# Patient Record
Sex: Female | Born: 1945 | Race: White | Hispanic: No | Smoking: Never smoker
Health system: Southern US, Community
[De-identification: ages and names within clinical notes are randomized; demographics above are authoritative.]

## PROBLEM LIST (undated history)

## (undated) DIAGNOSIS — N390 Urinary tract infection, site not specified: Secondary | ICD-10-CM

## (undated) DIAGNOSIS — L905 Scar conditions and fibrosis of skin: Secondary | ICD-10-CM

## (undated) DIAGNOSIS — K5900 Constipation, unspecified: Secondary | ICD-10-CM

## (undated) DIAGNOSIS — M109 Gout, unspecified: Secondary | ICD-10-CM

## (undated) DIAGNOSIS — D509 Iron deficiency anemia, unspecified: Secondary | ICD-10-CM

## (undated) DIAGNOSIS — R1312 Dysphagia, oropharyngeal phase: Secondary | ICD-10-CM

## (undated) DIAGNOSIS — E785 Hyperlipidemia, unspecified: Secondary | ICD-10-CM

## (undated) DIAGNOSIS — I209 Angina pectoris, unspecified: Secondary | ICD-10-CM

## (undated) DIAGNOSIS — K219 Gastro-esophageal reflux disease without esophagitis: Secondary | ICD-10-CM

## (undated) DIAGNOSIS — H5410 Blindness, one eye, low vision other eye, unspecified eyes: Secondary | ICD-10-CM

## (undated) DIAGNOSIS — M6281 Muscle weakness (generalized): Secondary | ICD-10-CM

## (undated) DIAGNOSIS — J4489 Other specified chronic obstructive pulmonary disease: Secondary | ICD-10-CM

## (undated) DIAGNOSIS — M62838 Other muscle spasm: Secondary | ICD-10-CM

## (undated) DIAGNOSIS — R0602 Shortness of breath: Secondary | ICD-10-CM

## (undated) DIAGNOSIS — I10 Essential (primary) hypertension: Secondary | ICD-10-CM

## (undated) DIAGNOSIS — R262 Difficulty in walking, not elsewhere classified: Secondary | ICD-10-CM

## (undated) DIAGNOSIS — E1129 Type 2 diabetes mellitus with other diabetic kidney complication: Secondary | ICD-10-CM

## (undated) DIAGNOSIS — R2689 Other abnormalities of gait and mobility: Secondary | ICD-10-CM

## (undated) DIAGNOSIS — Z5189 Encounter for other specified aftercare: Secondary | ICD-10-CM

## (undated) DIAGNOSIS — J449 Chronic obstructive pulmonary disease, unspecified: Secondary | ICD-10-CM

## (undated) DIAGNOSIS — I5030 Unspecified diastolic (congestive) heart failure: Secondary | ICD-10-CM

## (undated) DIAGNOSIS — G4700 Insomnia, unspecified: Secondary | ICD-10-CM

## (undated) DIAGNOSIS — E559 Vitamin D deficiency, unspecified: Secondary | ICD-10-CM

## (undated) DIAGNOSIS — I214 Non-ST elevation (NSTEMI) myocardial infarction: Secondary | ICD-10-CM

## (undated) DIAGNOSIS — J309 Allergic rhinitis, unspecified: Secondary | ICD-10-CM

## (undated) DIAGNOSIS — I2510 Atherosclerotic heart disease of native coronary artery without angina pectoris: Secondary | ICD-10-CM

## (undated) DIAGNOSIS — R21 Rash and other nonspecific skin eruption: Secondary | ICD-10-CM

## (undated) DIAGNOSIS — I509 Heart failure, unspecified: Secondary | ICD-10-CM

## (undated) DIAGNOSIS — E876 Hypokalemia: Secondary | ICD-10-CM

## (undated) DIAGNOSIS — R52 Pain, unspecified: Secondary | ICD-10-CM

## (undated) DIAGNOSIS — R7881 Bacteremia: Secondary | ICD-10-CM

## (undated) DIAGNOSIS — F319 Bipolar disorder, unspecified: Secondary | ICD-10-CM

## (undated) HISTORY — PX: GASTRIC BYPASS: SHX52

## (undated) HISTORY — PX: CHOLECYSTECTOMY: SHX55

## (undated) HISTORY — PX: ABDOMINAL SURGERY: SHX537

## (undated) HISTORY — PX: ABDOMINAL HYSTERECTOMY: SHX81

---

## 2015-04-23 ENCOUNTER — Other Ambulatory Visit
Admission: RE | Admit: 2015-04-23 | Discharge: 2015-04-23 | Disposition: A | Discharge status: Home / Self Care | Payer: Medicare (Managed Care) | Source: Skilled Nursing Facility | Attending: Family Medicine | Admitting: Family Medicine

## 2015-04-23 DIAGNOSIS — R7881 Bacteremia: Secondary | ICD-10-CM | POA: Diagnosis present

## 2015-04-23 LAB — CREATININE, SERUM
Creatinine, Ser: 1.61 mg/dL — ABNORMAL HIGH (ref 0.44–1.00)
GFR calc non Af Amer: 31 mL/min — ABNORMAL LOW (ref >60)
GFR, EST AFRICAN AMERICAN: 36 mL/min — AB (ref >60)

## 2015-04-23 LAB — VANCOMYCIN, TROUGH: VANCOMYCIN TR: 31 ug/mL — AB (ref 10–20)

## 2017-10-25 ENCOUNTER — Other Ambulatory Visit
Admission: RE | Admit: 2017-10-25 | Discharge: 2017-10-25 | Disposition: A | Discharge status: Home / Self Care | Payer: Medicare Other | Source: Ambulatory Visit | Attending: Family Medicine | Admitting: Family Medicine

## 2017-10-25 DIAGNOSIS — I5030 Unspecified diastolic (congestive) heart failure: Secondary | ICD-10-CM | POA: Diagnosis present

## 2017-10-25 LAB — BRAIN NATRIURETIC PEPTIDE: B NATRIURETIC PEPTIDE 5: 422 pg/mL — AB (ref 0.0–100.0)

## 2017-10-27 ENCOUNTER — Other Ambulatory Visit
Admission: RE | Admit: 2017-10-27 | Discharge: 2017-10-27 | Disposition: A | Discharge status: Home / Self Care | Payer: Medicare Other | Source: Ambulatory Visit | Attending: Family Medicine | Admitting: Family Medicine

## 2017-10-27 DIAGNOSIS — R0602 Shortness of breath: Secondary | ICD-10-CM | POA: Insufficient documentation

## 2017-10-27 LAB — BRAIN NATRIURETIC PEPTIDE: B Natriuretic Peptide: 301 pg/mL — ABNORMAL HIGH (ref 0.0–100.0)

## 2017-11-03 ENCOUNTER — Encounter: Payer: Self-pay | Admitting: Emergency Medicine

## 2017-11-03 ENCOUNTER — Other Ambulatory Visit: Payer: Self-pay

## 2017-11-03 ENCOUNTER — Inpatient Hospital Stay
Admission: EM | Admit: 2017-11-03 | Discharge: 2017-11-08 | DRG: 683 | Disposition: A | Discharge status: Skilled Nursing Facility | Payer: Medicare Other | Attending: Internal Medicine | Admitting: Internal Medicine

## 2017-11-03 DIAGNOSIS — Z90710 Acquired absence of both cervix and uterus: Secondary | ICD-10-CM

## 2017-11-03 DIAGNOSIS — I252 Old myocardial infarction: Secondary | ICD-10-CM

## 2017-11-03 DIAGNOSIS — Z794 Long term (current) use of insulin: Secondary | ICD-10-CM | POA: Diagnosis not present

## 2017-11-03 DIAGNOSIS — R109 Unspecified abdominal pain: Secondary | ICD-10-CM

## 2017-11-03 DIAGNOSIS — E1122 Type 2 diabetes mellitus with diabetic chronic kidney disease: Secondary | ICD-10-CM | POA: Diagnosis present

## 2017-11-03 DIAGNOSIS — Z882 Allergy status to sulfonamides status: Secondary | ICD-10-CM | POA: Diagnosis not present

## 2017-11-03 DIAGNOSIS — Z888 Allergy status to other drugs, medicaments and biological substances status: Secondary | ICD-10-CM

## 2017-11-03 DIAGNOSIS — I130 Hypertensive heart and chronic kidney disease with heart failure and stage 1 through stage 4 chronic kidney disease, or unspecified chronic kidney disease: Secondary | ICD-10-CM | POA: Diagnosis present

## 2017-11-03 DIAGNOSIS — K219 Gastro-esophageal reflux disease without esophagitis: Secondary | ICD-10-CM | POA: Diagnosis present

## 2017-11-03 DIAGNOSIS — M109 Gout, unspecified: Secondary | ICD-10-CM | POA: Diagnosis present

## 2017-11-03 DIAGNOSIS — J449 Chronic obstructive pulmonary disease, unspecified: Secondary | ICD-10-CM | POA: Diagnosis present

## 2017-11-03 DIAGNOSIS — D509 Iron deficiency anemia, unspecified: Secondary | ICD-10-CM | POA: Diagnosis present

## 2017-11-03 DIAGNOSIS — E871 Hypo-osmolality and hyponatremia: Secondary | ICD-10-CM | POA: Diagnosis present

## 2017-11-03 DIAGNOSIS — F319 Bipolar disorder, unspecified: Secondary | ICD-10-CM | POA: Diagnosis present

## 2017-11-03 DIAGNOSIS — Z9884 Bariatric surgery status: Secondary | ICD-10-CM

## 2017-11-03 DIAGNOSIS — T502X5A Adverse effect of carbonic-anhydrase inhibitors, benzothiadiazides and other diuretics, initial encounter: Secondary | ICD-10-CM | POA: Diagnosis present

## 2017-11-03 DIAGNOSIS — I2510 Atherosclerotic heart disease of native coronary artery without angina pectoris: Secondary | ICD-10-CM | POA: Diagnosis present

## 2017-11-03 DIAGNOSIS — Z885 Allergy status to narcotic agent status: Secondary | ICD-10-CM | POA: Diagnosis not present

## 2017-11-03 DIAGNOSIS — H5440 Blindness, one eye, unspecified eye: Secondary | ICD-10-CM | POA: Diagnosis present

## 2017-11-03 DIAGNOSIS — N179 Acute kidney failure, unspecified: Principal | ICD-10-CM

## 2017-11-03 DIAGNOSIS — E785 Hyperlipidemia, unspecified: Secondary | ICD-10-CM | POA: Diagnosis present

## 2017-11-03 DIAGNOSIS — I5032 Chronic diastolic (congestive) heart failure: Secondary | ICD-10-CM | POA: Diagnosis present

## 2017-11-03 DIAGNOSIS — E119 Type 2 diabetes mellitus without complications: Secondary | ICD-10-CM

## 2017-11-03 DIAGNOSIS — Z79899 Other long term (current) drug therapy: Secondary | ICD-10-CM

## 2017-11-03 DIAGNOSIS — E860 Dehydration: Secondary | ICD-10-CM | POA: Diagnosis present

## 2017-11-03 DIAGNOSIS — I10 Essential (primary) hypertension: Secondary | ICD-10-CM | POA: Diagnosis present

## 2017-11-03 DIAGNOSIS — Z7982 Long term (current) use of aspirin: Secondary | ICD-10-CM | POA: Diagnosis not present

## 2017-11-03 DIAGNOSIS — Z7902 Long term (current) use of antithrombotics/antiplatelets: Secondary | ICD-10-CM

## 2017-11-03 HISTORY — DX: Rash and other nonspecific skin eruption: R21

## 2017-11-03 HISTORY — DX: Other muscle spasm: M62.838

## 2017-11-03 HISTORY — DX: Vitamin D deficiency, unspecified: E55.9

## 2017-11-03 HISTORY — DX: Chronic obstructive pulmonary disease, unspecified: J44.9

## 2017-11-03 HISTORY — DX: Bacteremia: R78.81

## 2017-11-03 HISTORY — DX: Unspecified diastolic (congestive) heart failure: I50.30

## 2017-11-03 HISTORY — DX: Shortness of breath: R06.02

## 2017-11-03 HISTORY — DX: Essential (primary) hypertension: I10

## 2017-11-03 HISTORY — DX: Hyperlipidemia, unspecified: E78.5

## 2017-11-03 HISTORY — DX: Non-ST elevation (NSTEMI) myocardial infarction: I21.4

## 2017-11-03 HISTORY — DX: Other abnormalities of gait and mobility: R26.89

## 2017-11-03 HISTORY — DX: Dysphagia, oropharyngeal phase: R13.12

## 2017-11-03 HISTORY — DX: Angina pectoris, unspecified: I20.9

## 2017-11-03 HISTORY — DX: Iron deficiency anemia, unspecified: D50.9

## 2017-11-03 HISTORY — DX: Gout, unspecified: M10.9

## 2017-11-03 HISTORY — DX: Hypomagnesemia: E83.42

## 2017-11-03 HISTORY — DX: Pain, unspecified: R52

## 2017-11-03 HISTORY — DX: Encounter for other specified aftercare: Z51.89

## 2017-11-03 HISTORY — DX: Gastro-esophageal reflux disease without esophagitis: K21.9

## 2017-11-03 HISTORY — DX: Insomnia, unspecified: G47.00

## 2017-11-03 HISTORY — DX: Difficulty in walking, not elsewhere classified: R26.2

## 2017-11-03 HISTORY — DX: Other specified chronic obstructive pulmonary disease: J44.89

## 2017-11-03 HISTORY — DX: Bipolar disorder, unspecified: F31.9

## 2017-11-03 HISTORY — DX: Hypokalemia: E87.6

## 2017-11-03 HISTORY — DX: Urinary tract infection, site not specified: N39.0

## 2017-11-03 HISTORY — DX: Scar conditions and fibrosis of skin: L90.5

## 2017-11-03 HISTORY — DX: Blindness, one eye, low vision other eye, unspecified eyes: H54.10

## 2017-11-03 HISTORY — DX: Muscle weakness (generalized): M62.81

## 2017-11-03 HISTORY — DX: Type 2 diabetes mellitus with other diabetic kidney complication: E11.29

## 2017-11-03 HISTORY — DX: Constipation, unspecified: K59.00

## 2017-11-03 HISTORY — DX: Allergic rhinitis, unspecified: J30.9

## 2017-11-03 HISTORY — DX: Atherosclerotic heart disease of native coronary artery without angina pectoris: I25.10

## 2017-11-03 LAB — COMPREHENSIVE METABOLIC PANEL
ALT: 11 U/L — ABNORMAL LOW (ref 14–54)
AST: 13 U/L — ABNORMAL LOW (ref 15–41)
Albumin: 3.1 g/dL — ABNORMAL LOW (ref 3.5–5.0)
Alkaline Phosphatase: 89 U/L (ref 38–126)
Anion gap: 11 (ref 5–15)
BUN: 65 mg/dL — ABNORMAL HIGH (ref 6–20)
CO2: 31 mmol/L (ref 22–32)
Calcium: 8.7 mg/dL — ABNORMAL LOW (ref 8.9–10.3)
Chloride: 77 mmol/L — ABNORMAL LOW (ref 101–111)
Creatinine, Ser: 3.36 mg/dL — ABNORMAL HIGH (ref 0.44–1.00)
GFR, EST AFRICAN AMERICAN: 15 mL/min — AB (ref >60)
GFR, EST NON AFRICAN AMERICAN: 13 mL/min — AB (ref >60)
Glucose, Bld: 131 mg/dL — ABNORMAL HIGH (ref 65–99)
POTASSIUM: 3.8 mmol/L (ref 3.5–5.1)
Sodium: 119 mmol/L — CL (ref 135–145)
Total Bilirubin: 0.5 mg/dL (ref 0.3–1.2)
Total Protein: 6.2 g/dL — ABNORMAL LOW (ref 6.5–8.1)

## 2017-11-03 LAB — CBC
HCT: 26.7 % — ABNORMAL LOW (ref 35.0–47.0)
Hemoglobin: 9.4 g/dL — ABNORMAL LOW (ref 12.0–16.0)
MCH: 30.8 pg (ref 26.0–34.0)
MCHC: 35 g/dL (ref 32.0–36.0)
MCV: 87.9 fL (ref 80.0–100.0)
Platelets: 220 10*3/uL (ref 150–440)
RBC: 3.04 MIL/uL — ABNORMAL LOW (ref 3.80–5.20)
RDW: 15.2 % — ABNORMAL HIGH (ref 11.5–14.5)
WBC: 6.9 10*3/uL (ref 3.6–11.0)

## 2017-11-03 LAB — URINALYSIS, COMPLETE (UACMP) WITH MICROSCOPIC
Bilirubin Urine: NEGATIVE
GLUCOSE, UA: NEGATIVE mg/dL
Hgb urine dipstick: NEGATIVE
Ketones, ur: NEGATIVE mg/dL
Leukocytes, UA: NEGATIVE
NITRITE: NEGATIVE
PH: 6 (ref 5.0–8.0)
Protein, ur: NEGATIVE mg/dL
Specific Gravity, Urine: 1.009 (ref 1.005–1.030)

## 2017-11-03 MED ORDER — SODIUM CHLORIDE 0.9 % IV SOLN
Freq: Once | INTRAVENOUS | Status: AC
  Administered 2017-11-03: 1000 mL via INTRAVENOUS

## 2017-11-03 NOTE — ED Notes (Signed)
Pt placed on bedpan and urine sample collected.

## 2017-11-03 NOTE — ED Triage Notes (Signed)
Pt to ED via EMS from [facility] c/o abnormal labs that were drawn today.  Kidney functions elevated and hypokalemia.  States bilateral leg cramping started today and generalized abd cramping today.  States some nausea today without vomiting.  EMS vitals 110/70 BP, 94% 3L [place] chronically, 60 HR.

## 2017-11-03 NOTE — ED Provider Notes (Signed)
[facility] Emergency Department Provider Note  Time seen: 8:55 PM  I have reviewed the triage vital signs and the nursing notes.   HISTORY  Chief Complaint Abnormal Lab    HPI Cathy Guzman is a 72 y.o. female with a past medical history of MI, CAD, hypertension, CHF, hyperlipidemia, diabetes, frequent UTIs, presents from [facility] for abnormal labs.  According to the patient for the past 1 week she has been feeling somewhat more weak than normal.  States she had blood work performed at [facility] today and was told that her kidney function and her potassium levels were abnormal and she needed to go to the emergency department.  Patient states besides the weakness she has not noted any other acute symptoms.  States some nausea but states this is chronic from her gastric reflux.  States some right knee pain began chronic.  Denies any acute abnormalities.  Denies any chest pain, vomiting or diarrhea.  Denies dysuria, daughter states she was recently treated for urinary tract infection however.  Past Medical History:  Diagnosis Date  . Acute myocardial infarction, subendocardial infarction, initial episode of care (HCC)   . Anginal syndrome (HCC)   . Atopic rhinitis   . Avitaminosis D   . Bacteremia   . Better eye: severe vision impairment; lesser eye: blind   . Bipolar disorder, unspecified (HCC)   . Cannot walk   . CN (constipation)   . Coronary atherosclerosis of native coronary artery   . Encounter for vocational therapy   . Esophageal reflux   . Essential hypertension, malignant   . Gout, unspecified   . Heart failure, diastolic (HCC)   . Hyperlipemia   . Hypomagnesemia   . Hypopotassemia   . Iron deficiency anemia, unspecified   . Muscle weakness (generalized)   . Obstructive chronic bronchitis without exacerbation (HCC)   . Oropharyngeal dysphagia   . Persistent disorder of initiating or maintaining sleep   . Rash and other nonspecific  skin eruption   . Scar painful   . Scissor gait   . Shortness of breath   . Spasm of muscle   . Type II diabetes mellitus with renal manifestations (HCC)   . Urinary tract infection, site not specified     There are no active problems to display for this patient.   Past Surgical History:  Procedure Laterality Date  . ABDOMINAL HYSTERECTOMY    . ABDOMINAL SURGERY    . CHOLECYSTECTOMY    . GASTRIC BYPASS      Prior to Admission medications   Not on File    Allergies  Allergen Reactions  . Ace Inhibitors   . Bactrim [Sulfamethoxazole-Trimethoprim]   . Doxycycline Hyclate   . Hydrocodone   . Oxycodone   . Sulfanilamide     History reviewed. No pertinent family history.  Social History Social History   Tobacco Use  . Smoking status: Never Smoker  . Smokeless tobacco: Never Used  Substance Use Topics  . Alcohol use: No    Frequency: Never  . Drug use: No    Review of Systems Constitutional: Negative for fever.  Positive for generalized weakness Eyes: Negative for visual complaints ENT: Negative for recent illness/congestion Cardiovascular: Negative for chest pain. Respiratory: Negative for shortness of breath.  Negative for cough or congestion Gastrointestinal: Negative for abdominal pain, vomiting positive for nausea. Genitourinary: Negative for urinary compaints.  States recent urinary tract infection. Musculoskeletal: Right knee pain, chronic Skin: Negative for skin complaints  Neurological: Negative for headache All other ROS negative  ____________________________________________   PHYSICAL EXAM:  VITAL SIGNS: ED Triage Vitals  Enc Vitals Group     BP 11/03/17 1959 135/68     Pulse Rate 11/03/17 1959 60     Resp 11/03/17 1959 18     Temp 11/03/17 1959 97.6 F (36.4 C)     Temp Source 11/03/17 1959 Oral     SpO2 11/03/17 1959 100 %     Weight 11/03/17 2000 268 lb (121.6 kg)     Height 11/03/17 2000 5\' 8"  (1.727 m)     Head Circumference --       [facility] --      Pain Score 11/03/17 2000 7     Pain Loc --      Pain Edu? --      Excl. in GC? --     Constitutional: Alert. Well appearing and in no distress. Eyes: Left eye gaze is fixed, chronic ENT   Head: Normocephalic and atraumatic.   Nose: No congestion/rhinnorhea.   Mouth/Throat: Mucous membranes are moist. Cardiovascular: Normal rate, regular rhythm. Respiratory: Normal respiratory effort without tachypnea nor retractions. Breath sounds are clear Gastrointestinal: Soft and nontender. No distention.  Musculoskeletal: Nontender with normal range of motion in all extremities.  Neurologic:  Normal speech and language. No gross focal neurologic deficits Skin:  Skin is warm, dry and intact.  Psychiatric: Mood and affect are normal.   ____________________________________________    EKG  EKG reviewed and interpreted by myself shows normal sinus rhythm at 56 bpm with a widened QRS, normal axis, largely normal intervals besides slight QT prolongation.  Nonspecific ST changes.  Occasional PVC.  ____________________________________________   INITIAL IMPRESSION / ASSESSMENT AND PLAN / ED COURSE  Pertinent labs & imaging results that were available during my care of the patient were reviewed by me and considered in my medical decision making (see chart for details).  Patient presents to the emergency department for abnormal lab values.  Differential would include abnormal labs such as electrolyte or metabolic abnormality, urinary tract infection.  We will check labs, urinalysis and closely monitor in the emergency department.  We will also obtain an EKG given the patient's complaint of generalized weakness.  I have reviewed the patient's records.  Patient has a history of recent admission for N STEMI, was deemed not to be a candidate for catheterization.  Was ultimately transferred to [facility] for generalized deconditioning and weakness.  We are unable to see the  patient as outpatient lab values.  We will repeat labs in the emergency department.  Patient's labs have resulted showing significant hyponatremia, likely the cause of her weakness.  We will start on IV fluids.  We will admit to the hospital for further treatment.  Patient agreeable this plan of care.    ____________________________________________   FINAL CLINICAL IMPRESSION(S) / ED DIAGNOSES  Weakness Hyponatremia   Minna AntisPaduchowski, Jemal Miskell, MD 11/03/17 2320

## 2017-11-03 NOTE — H&P (Signed)
[facility] [place] [facility] at [facility]   PATIENT NAME: Cathy Guzman    MR#:  [id number]  DATE OF BIRTH:  1946/06/07  DATE OF ADMISSION:  11/03/2017  PRIMARY CARE PHYSICIAN: Dorothey Baseman, MD   REQUESTING/REFERRING PHYSICIAN: Lenard Lance, MD  CHIEF COMPLAINT:   Chief Complaint  Patient presents with  . Abnormal Lab    HISTORY OF PRESENT ILLNESS:  Cathy Guzman  is a 72 y.o. female who presents with around 1 week of progressive weakness.  Patient was admitted about a month ago at [facility] for chest pain and had NSTEMI.  She was discharged after treatment to rehab facility.  While there she was started on Zaroxolyn in a timeframe just before her current symptoms started.  She does have chronic renal disease.  Given her hyponatremia and her profound weakness, hospitals were called for admission and further treatment.  PAST MEDICAL HISTORY:   Past Medical History:  Diagnosis Date  . Acute myocardial infarction, subendocardial infarction, initial episode of care (HCC)   . Anginal syndrome (HCC)   . Atopic rhinitis   . Avitaminosis D   . Bacteremia   . Better eye: severe vision impairment; lesser eye: blind   . Bipolar disorder, unspecified (HCC)   . Cannot walk   . CN (constipation)   . Coronary atherosclerosis of native coronary artery   . Encounter for vocational therapy   . Esophageal reflux   . Essential hypertension, malignant   . Gout, unspecified   . Heart failure, diastolic (HCC)   . Hyperlipemia   . Hypomagnesemia   . Hypopotassemia   . Iron deficiency anemia, unspecified   . Muscle weakness (generalized)   . Obstructive chronic bronchitis without exacerbation (HCC)   . Oropharyngeal dysphagia   . Persistent disorder of initiating or maintaining sleep   . Rash and other nonspecific skin eruption   . Scar painful   . Scissor gait   . Shortness of breath   . Spasm of muscle   . Type II diabetes mellitus with renal manifestations  (HCC)   . Urinary tract infection, site not specified     PAST SURGICAL HISTORY:   Past Surgical History:  Procedure Laterality Date  . ABDOMINAL HYSTERECTOMY    . ABDOMINAL SURGERY    . CHOLECYSTECTOMY    . GASTRIC BYPASS      SOCIAL HISTORY:   Social History   Tobacco Use  . Smoking status: Never Smoker  . Smokeless tobacco: Never Used  Substance Use Topics  . Alcohol use: No    Frequency: Never    FAMILY HISTORY:   Family History  Problem Relation Age of Onset  . CAD Mother   . Glaucoma Mother   . COPD Father   . COPD Brother   . Parkinsonism Brother     DRUG ALLERGIES:   Allergies  Allergen Reactions  . Ace Inhibitors   . Bactrim [Sulfamethoxazole-Trimethoprim]   . Doxycycline Hyclate   . Hydrocodone   . Oxycodone   . Sulfanilamide     MEDICATIONS AT HOME:   Prior to Admission medications   Medication Sig Start Date End Date Taking? Authorizing Provider  albuterol (PROVENTIL) (2.5 MG/3ML) 0.083% nebulizer solution Take 2.5 mg by nebulization every 4 (four) hours as needed for wheezing or shortness of breath.   Yes [provider]  allopurinol (ZYLOPRIM) 100 MG tablet Take 100 mg by mouth daily.   Yes [provider]  amLODipine (NORVASC) 10 MG tablet Take 10  mg by mouth daily.   Yes [provider]  ARIPiprazole (ABILIFY) 20 MG tablet Take 20 mg by mouth daily.   Yes [provider]  aspirin EC 81 MG tablet Take 81 mg by mouth daily.   Yes [provider]  atorvastatin (LIPITOR) 80 MG tablet Take 80 mg by mouth daily.   Yes [provider]  clopidogrel (PLAVIX) 75 MG tablet Take 75 mg by mouth daily.   Yes [provider]  diclofenac sodium (VOLTAREN) 1 % GEL Apply 2 g topically 4 (four) times daily.   Yes [provider]  dorzolamide (TRUSOPT) 2 % ophthalmic solution Place 1 drop into the right eye 2 (two) times daily.   Yes [provider]  ferrous sulfate 325 (65 FE)  MG EC tablet Take 325 mg by mouth daily with breakfast.   Yes [provider]  fluticasone (FLONASE) 50 MCG/ACT nasal spray Place 1 spray into both nostrils daily.   Yes [provider]  Fluticasone-Salmeterol (ADVAIR) 500-50 MCG/DOSE AEPB Inhale 1 puff into the lungs 2 (two) times daily.   Yes [provider]  furosemide (LASIX) 20 MG tablet Take 20 mg by mouth 2 (two) times daily.   Yes [provider]  insulin glargine (LANTUS) 100 UNIT/ML injection Inject 17 Units into the skin at bedtime.   Yes [provider]  isosorbide mononitrate (IMDUR) 30 MG 24 hr tablet Take 90 mg by mouth daily.   Yes [provider]  lamoTRIgine (LAMICTAL) 100 MG tablet Take 150 mg by mouth daily.   Yes [provider]  Melatonin 5 MG TABS Take 10 mg by mouth at bedtime as needed (sleep).   Yes [provider]  metolazone (ZAROXOLYN) 2.5 MG tablet Take 2.5 mg by mouth daily.   Yes [provider]  metoprolol tartrate (LOPRESSOR) 50 MG tablet Take 100 mg by mouth 2 (two) times daily.   Yes [provider]  Omega 3 1000 MG CAPS Take 1,000 mg by mouth every evening.   Yes [provider]  omeprazole (PRILOSEC) 20 MG capsule Take 40 mg by mouth 2 (two) times daily before a meal.   Yes [provider]  QUEtiapine (SEROQUEL) 25 MG tablet Take 25 mg by mouth 2 (two) times daily.   Yes [provider]  QUEtiapine (SEROQUEL) 50 MG tablet Take 50 mg by mouth at bedtime.   Yes [provider]  senna (SENOKOT) 8.6 MG TABS tablet Take 2 tablets by mouth at bedtime as needed for mild constipation.   Yes [provider]  sertraline (ZOLOFT) 100 MG tablet Take 200 mg by mouth at bedtime.   Yes [provider]  sucralfate (CARAFATE) 1 g tablet Take 1 g by mouth 4 (four) times daily.   Yes [provider]  tiotropium (SPIRIVA) 18 MCG inhalation capsule Place 18 mcg into inhaler and  inhale daily.   Yes [provider]    REVIEW OF SYSTEMS:  Review of Systems  Constitutional: Negative for chills, fever, malaise/fatigue and weight loss.  HENT: Negative for ear pain, hearing loss and tinnitus.   Eyes: Negative for blurred vision, double vision, pain and redness.  Respiratory: Negative for cough, hemoptysis and shortness of breath.   Cardiovascular: Negative for chest pain, palpitations, orthopnea and leg swelling.  Gastrointestinal: Negative for abdominal pain, constipation, diarrhea, nausea and vomiting.  Genitourinary: Negative for dysuria, frequency and hematuria.  Musculoskeletal: Negative for back pain, joint pain and neck pain.  Skin:       No acne, rash, or lesions  Neurological: Positive for weakness. Negative for dizziness, tremors and focal weakness.  Endo/Heme/Allergies: Negative for polydipsia. Does not bruise/bleed easily.  Psychiatric/Behavioral: Negative for depression. The patient is not nervous/anxious and does not have insomnia.      VITAL SIGNS:   Vitals:   11/03/17 2200 11/03/17 2230 11/03/17 2300 11/03/17 2337  BP:    127/62  Pulse:    (!) 55  Resp: 17 14 13 16   Temp:      TempSrc:      SpO2:    99%  Weight:      Height:       Wt Readings from Last 3 Encounters:  11/03/17 121.6 kg (268 lb)    PHYSICAL EXAMINATION:  Physical Exam  Vitals reviewed. Constitutional: She is oriented to person, place, and time. She appears well-developed and well-nourished. No distress.  HENT:  Head: Normocephalic and atraumatic.  Mouth/Throat: Oropharynx is clear and moist.  Eyes: Conjunctivae and EOM are normal. Pupils are equal, round, and reactive to light. No scleral icterus.  Neck: Normal range of motion. Neck supple. No JVD present. No thyromegaly present.  Cardiovascular: Normal rate, regular rhythm and intact distal pulses. Exam reveals no gallop and no friction rub.  No murmur heard. Respiratory: Effort normal and breath sounds  normal. No respiratory distress. She has no wheezes. She has no rales.  GI: Soft. Bowel sounds are normal. She exhibits no distension. There is no tenderness.  Musculoskeletal: Normal range of motion. She exhibits no edema.  No arthritis, no gout  Lymphadenopathy:    She has no cervical adenopathy.  Neurological: She is alert and oriented to person, place, and time. No cranial nerve deficit.  Generalized weakness with no focal deficit  Skin: Skin is warm and dry. No rash noted. No erythema.  Psychiatric: She has a normal mood and affect. Her behavior is normal. Judgment and thought content normal.    LABORATORY PANEL:   CBC Recent Labs  Lab 11/03/17 2050  WBC 6.9  HGB 9.4*  HCT 26.7*  PLT 220   ------------------------------------------------------------------------------------------------------------------  Chemistries  Recent Labs  Lab 11/03/17 2050  NA 119*  K 3.8  CL 77*  CO2 31  GLUCOSE 131*  BUN 65*  CREATININE 3.36*  CALCIUM 8.7*  AST 13*  ALT 11*  ALKPHOS 89  BILITOT 0.5   ------------------------------------------------------------------------------------------------------------------  Cardiac Enzymes No results for input(s): TROPONINI in the last 168 hours. ------------------------------------------------------------------------------------------------------------------  RADIOLOGY:  No results found.  EKG:   Orders placed or performed during the hospital encounter of 11/03/17  . ED EKG  . ED EKG  . EKG 12-Lead  . EKG 12-Lead    IMPRESSION AND PLAN:  Principal Problem:   Hyponatremia -strong suspicion for Zaroxolyn induced hyponatremia with strong correlation between timing of medication initiation and onset of symptoms.  We will hold this medication while she is here and treat her initially with normal saline.  We will be cautious with fluid administration given her history of heart failure. Active Problems:   Diabetes (HCC) -sliding scale  insulin with corresponding glucose checks   COPD (chronic obstructive pulmonary disease) (HCC) -home dose inhalers   CAD (coronary artery disease) -continue home meds   Chronic diastolic CHF (congestive heart failure) (HCC) -continue home medications, otherwise very cautious fluid administration as above   HTN (hypertension) -stable, continue home meds   HLD (hyperlipidemia) -Home dose antilipid   GERD (gastroesophageal reflux disease) -  home dose PPI  All the records are reviewed and case discussed with ED provider. Management plans discussed with the patient and/or family.  DVT PROPHYLAXIS: SubQ heparin  GI PROPHYLAXIS: PPI  ADMISSION STATUS: Inpatient  CODE STATUS: Full Code Status History    This patient does not have a recorded code status. Please follow your organizational policy for patients in this situation.    Advance Directive Documentation     Most Recent Value  Type of Advance Directive  Healthcare Power of Attorney, Out of facility DNR (pink MOST or yellow form)  Pre-existing out of facility DNR order (yellow form or pink MOST form)  Pink MOST form placed in chart (order not valid for inpatient use)  "MOST" Form in Place?  No data      TOTAL TIME TAKING CARE OF THIS PATIENT: 45 minutes.   Iyesha Such FIELDING 11/03/2017, 11:56 PM  [facility]  Office  [phone]  CC: Primary care physician; Dorothey BasemanBronstein, Jahlia Omura, MD  Note:  This document was prepared using Dragon voice recognition software and may include unintentional dictation errors.

## 2017-11-03 NOTE — ED Notes (Signed)
ED Provider at bedside. 

## 2017-11-04 LAB — GLUCOSE, CAPILLARY
GLUCOSE-CAPILLARY: 149 mg/dL — AB (ref 65–99)
GLUCOSE-CAPILLARY: 135 mg/dL — AB (ref 65–99)
GLUCOSE-CAPILLARY: 142 mg/dL — AB (ref 65–99)
Glucose-Capillary: 120 mg/dL — ABNORMAL HIGH (ref 65–99)
Glucose-Capillary: 113 mg/dL — ABNORMAL HIGH (ref 65–99)

## 2017-11-04 LAB — BASIC METABOLIC PANEL
ANION GAP: 9 (ref 5–15)
BUN: 65 mg/dL — ABNORMAL HIGH (ref 6–20)
CO2: 31 mmol/L (ref 22–32)
Calcium: 8.4 mg/dL — ABNORMAL LOW (ref 8.9–10.3)
Chloride: 80 mmol/L — ABNORMAL LOW (ref 101–111)
Creatinine, Ser: 3.28 mg/dL — ABNORMAL HIGH (ref 0.44–1.00)
GFR calc non Af Amer: 13 mL/min — ABNORMAL LOW (ref >60)
GFR, EST AFRICAN AMERICAN: 15 mL/min — AB (ref >60)
Glucose, Bld: 114 mg/dL — ABNORMAL HIGH (ref 65–99)
POTASSIUM: 3.4 mmol/L — AB (ref 3.5–5.1)
Sodium: 120 mmol/L — ABNORMAL LOW (ref 135–145)

## 2017-11-04 LAB — CBC
HEMATOCRIT: 26.7 % — AB (ref 35.0–47.0)
HEMOGLOBIN: 9 g/dL — AB (ref 12.0–16.0)
MCH: 29.6 pg (ref 26.0–34.0)
MCHC: 33.6 g/dL (ref 32.0–36.0)
MCV: 88 fL (ref 80.0–100.0)
Platelets: 186 10*3/uL (ref 150–440)
RBC: 3.03 MIL/uL — AB (ref 3.80–5.20)
RDW: 15 % — ABNORMAL HIGH (ref 11.5–14.5)
WBC: 6.3 10*3/uL (ref 3.6–11.0)

## 2017-11-04 LAB — SODIUM: Sodium: 119 mmol/L — CL (ref 135–145)

## 2017-11-04 MED ORDER — ONDANSETRON HCL 4 MG PO TABS
4.0000 mg | ORAL_TABLET | Freq: Four times a day (QID) | ORAL | Status: DC | PRN
  Administered 2017-11-05: 4 mg via ORAL
  Filled 2017-11-04: qty 1

## 2017-11-04 MED ORDER — QUETIAPINE FUMARATE 25 MG PO TABS
50.0000 mg | ORAL_TABLET | Freq: Every day | ORAL | Status: DC
  Administered 2017-11-04 – 2017-11-07 (×4): 50 mg via ORAL
  Filled 2017-11-04 (×5): qty 2

## 2017-11-04 MED ORDER — TIOTROPIUM BROMIDE MONOHYDRATE 18 MCG IN CAPS
18.0000 ug | ORAL_CAPSULE | Freq: Every day | RESPIRATORY_TRACT | Status: DC
  Administered 2017-11-04 – 2017-11-08 (×5): 18 ug via RESPIRATORY_TRACT
  Filled 2017-11-04: qty 5

## 2017-11-04 MED ORDER — ASPIRIN EC 81 MG PO TBEC
81.0000 mg | DELAYED_RELEASE_TABLET | Freq: Every day | ORAL | Status: DC
  Administered 2017-11-04 – 2017-11-07 (×4): 81 mg via ORAL
  Filled 2017-11-04 (×4): qty 1

## 2017-11-04 MED ORDER — DORZOLAMIDE HCL 2 % OP SOLN
1.0000 [drp] | Freq: Two times a day (BID) | OPHTHALMIC | Status: DC
  Administered 2017-11-04 – 2017-11-07 (×8): 1 [drp] via OPHTHALMIC
  Filled 2017-11-04: qty 10

## 2017-11-04 MED ORDER — SERTRALINE HCL 50 MG PO TABS
200.0000 mg | ORAL_TABLET | Freq: Every day | ORAL | Status: DC
  Administered 2017-11-04 – 2017-11-07 (×4): 200 mg via ORAL
  Filled 2017-11-04 (×4): qty 4

## 2017-11-04 MED ORDER — MOMETASONE FURO-FORMOTEROL FUM 200-5 MCG/ACT IN AERO
2.0000 | INHALATION_SPRAY | Freq: Two times a day (BID) | RESPIRATORY_TRACT | Status: DC
Start: 2017-11-04 — End: 2017-11-08
  Administered 2017-11-04 – 2017-11-08 (×9): 2 via RESPIRATORY_TRACT
  Filled 2017-11-04: qty 8.8

## 2017-11-04 MED ORDER — PANTOPRAZOLE SODIUM 40 MG PO TBEC
40.0000 mg | DELAYED_RELEASE_TABLET | Freq: Two times a day (BID) | ORAL | Status: DC
  Administered 2017-11-04 – 2017-11-07 (×8): 40 mg via ORAL
  Filled 2017-11-04 (×8): qty 1

## 2017-11-04 MED ORDER — ARIPIPRAZOLE 10 MG PO TABS
20.0000 mg | ORAL_TABLET | Freq: Every day | ORAL | Status: DC
  Administered 2017-11-04 – 2017-11-07 (×4): 20 mg via ORAL
  Filled 2017-11-04 (×5): qty 2

## 2017-11-04 MED ORDER — ACETAMINOPHEN 325 MG PO TABS
650.0000 mg | ORAL_TABLET | Freq: Four times a day (QID) | ORAL | Status: DC | PRN
Start: 2017-11-04 — End: 2017-11-08
  Administered 2017-11-05 – 2017-11-07 (×3): 650 mg via ORAL
  Filled 2017-11-04 (×3): qty 2

## 2017-11-04 MED ORDER — INSULIN ASPART 100 UNIT/ML ~~LOC~~ SOLN: 0.0000 [IU] | Freq: Every day | SUBCUTANEOUS | Status: DC

## 2017-11-04 MED ORDER — ATORVASTATIN CALCIUM 20 MG PO TABS
80.0000 mg | ORAL_TABLET | Freq: Every day | ORAL | Status: DC
  Administered 2017-11-04 – 2017-11-07 (×4): 80 mg via ORAL
  Filled 2017-11-04 (×4): qty 4

## 2017-11-04 MED ORDER — SODIUM CHLORIDE 0.9 % IV SOLN: INTRAVENOUS | Status: AC

## 2017-11-04 MED ORDER — ISOSORBIDE MONONITRATE ER 30 MG PO TB24
90.0000 mg | ORAL_TABLET | Freq: Every day | ORAL | Status: DC
  Administered 2017-11-04 – 2017-11-07 (×4): 90 mg via ORAL
  Filled 2017-11-04 (×4): qty 3

## 2017-11-04 MED ORDER — LAMOTRIGINE 100 MG PO TABS
150.0000 mg | ORAL_TABLET | Freq: Every day | ORAL | Status: DC
  Administered 2017-11-04 – 2017-11-07 (×4): 150 mg via ORAL
  Filled 2017-11-04 (×4): qty 1

## 2017-11-04 MED ORDER — ALBUTEROL SULFATE (2.5 MG/3ML) 0.083% IN NEBU
2.5000 mg | INHALATION_SOLUTION | Freq: Once | RESPIRATORY_TRACT | Status: AC
  Administered 2017-11-04: 2.5 mg via RESPIRATORY_TRACT
  Filled 2017-11-04: qty 3

## 2017-11-04 MED ORDER — QUETIAPINE FUMARATE 25 MG PO TABS
25.0000 mg | ORAL_TABLET | Freq: Two times a day (BID) | ORAL | Status: DC
  Administered 2017-11-04 – 2017-11-07 (×8): 25 mg via ORAL
  Filled 2017-11-04 (×7): qty 1

## 2017-11-04 MED ORDER — ACETAMINOPHEN 650 MG RE SUPP
650.0000 mg | Freq: Four times a day (QID) | RECTAL | Status: DC | PRN
Start: 2017-11-04 — End: 2017-11-08

## 2017-11-04 MED ORDER — CLOPIDOGREL BISULFATE 75 MG PO TABS
75.0000 mg | ORAL_TABLET | Freq: Every day | ORAL | Status: DC
  Administered 2017-11-04 – 2017-11-07 (×4): 75 mg via ORAL
  Filled 2017-11-04 (×4): qty 1

## 2017-11-04 MED ORDER — INSULIN ASPART 100 UNIT/ML ~~LOC~~ SOLN
0.0000 [IU] | Freq: Three times a day (TID) | SUBCUTANEOUS | Status: DC
  Administered 2017-11-04: 1 [IU] via SUBCUTANEOUS
  Administered 2017-11-05: 2 [IU] via SUBCUTANEOUS
  Administered 2017-11-06: 1 [IU] via SUBCUTANEOUS
  Administered 2017-11-06: 2 [IU] via SUBCUTANEOUS
  Administered 2017-11-07 (×2): 1 [IU] via SUBCUTANEOUS
  Filled 2017-11-04 (×6): qty 1

## 2017-11-04 MED ORDER — AMLODIPINE BESYLATE 10 MG PO TABS
10.0000 mg | ORAL_TABLET | Freq: Every day | ORAL | Status: DC
  Administered 2017-11-04 – 2017-11-06 (×3): 10 mg via ORAL
  Filled 2017-11-04 (×4): qty 1

## 2017-11-04 MED ORDER — METOPROLOL TARTRATE 50 MG PO TABS
100.0000 mg | ORAL_TABLET | Freq: Two times a day (BID) | ORAL | Status: DC
  Administered 2017-11-04 – 2017-11-07 (×5): 100 mg via ORAL
  Filled 2017-11-04 (×8): qty 2

## 2017-11-04 MED ORDER — HEPARIN SODIUM (PORCINE) 5000 UNIT/ML IJ SOLN
5000.0000 [IU] | Freq: Three times a day (TID) | INTRAMUSCULAR | Status: DC
  Administered 2017-11-04 – 2017-11-07 (×11): 5000 [IU] via SUBCUTANEOUS
  Filled 2017-11-04 (×11): qty 1

## 2017-11-04 MED ORDER — ONDANSETRON HCL 4 MG/2ML IJ SOLN: 4.0000 mg | Freq: Four times a day (QID) | INTRAMUSCULAR | Status: DC | PRN

## 2017-11-04 NOTE — Progress Notes (Signed)
Patient transferred to floor from ED, requested to be "left alone, to be able to rest.  In reviewing medications, patient refused at this time, with exception of fluids.  RN informed MD and pharmacy. Requested for times to be adjusted.

## 2017-11-04 NOTE — ED Notes (Addendum)
Transport to floor 2C -room 203.AS

## 2017-11-04 NOTE — Progress Notes (Signed)
[facility] - [place] at [facility]   PATIENT NAME: Cathy KassCynthia Guzman    MR#:  [id number]  DATE OF BIRTH:  03/25/1946  SUBJECTIVE:   Patient was seen in with abnormal labs which was done for her workup of progressive weakness.  She was found to have low sodium.  She feels a lot better.  Ate breakfast. No family in the room REVIEW OF SYSTEMS:   Review of Systems  Constitutional: Negative for chills, fever and weight loss.  HENT: Negative for ear discharge, ear pain and nosebleeds.   Eyes: Negative for blurred vision, pain and discharge.  Respiratory: Negative for sputum production, shortness of breath, wheezing and stridor.   Cardiovascular: Negative for chest pain, palpitations, orthopnea and PND.  Gastrointestinal: Negative for abdominal pain, diarrhea, nausea and vomiting.  Genitourinary: Negative for frequency and urgency.  Musculoskeletal: Negative for back pain and joint pain.  Neurological: Negative for sensory change, speech change, focal weakness and weakness.  Psychiatric/Behavioral: Negative for depression and hallucinations. The patient is not nervous/anxious.    Tolerating Diet:yes Tolerating PT: bed bound--chronic  DRUG ALLERGIES:   Allergies  Allergen Reactions  . Ace Inhibitors   . Bactrim [Sulfamethoxazole-Trimethoprim]   . Doxycycline Hyclate   . Hydrocodone   . Oxycodone   . Sulfanilamide     VITALS:  Blood pressure (!) 132/51, pulse 60, temperature 97.8 F (36.6 C), temperature source Oral, resp. rate 20, height 5\' 8"  (1.727 m), weight 113.9 kg (251 lb 1.6 oz), SpO2 95 %.  PHYSICAL EXAMINATION:   Physical Exam  GENERAL:  72 y.o.-year-old patient lying in the bed with no acute distress. Obese EYES: Pupils equal, round, reactive to light and accommodation. No scleral icterus. Extraocular muscles intact.  HEENT: Head atraumatic, normocephalic. Oropharynx and nasopharynx clear.  NECK:  Supple, no jugular venous distention. No  thyroid enlargement, no tenderness.  LUNGS: decreased breath sounds bilaterally, no wheezing, rales, rhonchi. No use of accessory muscles of respiration.  CARDIOVASCULAR: S1, S2 normal. No murmurs, rubs, or gallops.  ABDOMEN: Soft, nontender, nondistended. Bowel sounds present. No organomegaly or mass.  EXTREMITIES: No cyanosis, clubbing or edema b/l.    NEUROLOGIC: Cranial nerves II through XII are intact. No focal Motor or sensory deficits b/l.   PSYCHIATRIC:  patient is alert and oriented x 3.  SKIN: No obvious rash, lesion, or ulcer.   LABORATORY PANEL:  CBC Recent Labs  Lab 11/04/17 0703  WBC 6.3  HGB 9.0*  HCT 26.7*  PLT 186    Chemistries  Recent Labs  Lab 11/03/17 2050 11/04/17 0703  NA 119* 120*  K 3.8 3.4*  CL 77* 80*  CO2 31 31  GLUCOSE 131* 114*  BUN 65* 65*  CREATININE 3.36* 3.28*  CALCIUM 8.7* 8.4*  AST 13*  --   ALT 11*  --   ALKPHOS 89  --   BILITOT 0.5  --    Cardiac Enzymes No results for input(s): TROPONINI in the last 168 hours. RADIOLOGY:  No results found. ASSESSMENT AND PLAN:  Cathy KassCynthia Guzman  is a 72 y.o. female who presents with around 1 week of progressive weakness.  Patient was [facility] a [facility] for chest pain and had NSTEMI.  She was discharged after treatment to rehab facility.  While there she was started on Zaroxolyn in a timeframe just before her current symptoms started  1. Acute Hyponatremia  -strong suspicion for Zaroxolyn induced hyponatremia with strong correlation between timing of medication initiation  and onset of symptoms. - We will hold this medication while she is here and treat her initially with normal saline.   - cautious with fluid administration given her history of heart failure. - came in  With sodium 119--120  2. Diabetes (HCC) -sliding scale insulin with corresponding glucose checks  3. COPD (chronic obstructive pulmonary disease) (HCC) -home dose inhalers  4. CAD (coronary artery  disease) -continue home meds    5.Chronic diastolic CHF (congestive heart failure) (HCC) -continue home medications, otherwise very cautious fluid administration as above    6.HTN (hypertension) -stable, continue home meds    7.HLD (hyperlipidemia) -Home dose antilipid    8.GERD (gastroesophageal reflux disease) -home dose PPI  CSW for d/c planning  Case discussed with Care Management/Social Worker. Management plans discussed with the patient and they are in agreement.  CODE STATUS: FULL  DVT Prophylaxis: lovenox  TOTAL TIME TAKING CARE OF THIS PATIENT: **30 minutes.  >50% time spent on counselling and coordination of care  POSSIBLE D/C IN *1-2 DAYS, DEPENDING ON CLINICAL CONDITION.  Note: This dictation was prepared with Dragon dictation along with smaller phrase technology. Any transcriptional errors that result from this process are unintentional.  Enedina Finner M.D on 11/04/2017 at 1:26 PM  Between 7am to 6pm - Pager - [phone]  After 6pm go to [email or web address] - Social research officer, government  [facility]  Office  [phone]  CC: Primary care physician; Dorothey Baseman, MDPatient ID: Cathy Guzman, female   DOB: [birth year], 72 y.o.   MRN: [id number]

## 2017-11-04 NOTE — [place] FL2 (Signed)
[place] MEDICAID FL2 LEVEL OF CARE SCREENING TOOL     IDENTIFICATION  Patient Name: Cathy Guzman Birthdate: [birth year] Sex: female Admission Date (Current Location): 11/03/2017  [place] and [place] Number:  Chiropodist and Address:  [facility], [address] [place], [place] [address]      Provider Number: [id number]  Attending Physician Name and Address:  Enedina Finner, MD  Relative Name and Phone Number:  Jeanita Carneiro (Daughter) [phone]    Current Level of Care: Hospital Recommended Level of Care: Skilled Nursing Facility Prior Approval Number:    Date Approved/Denied:   PASRR Number: [id number]  Discharge Plan: SNF    Current Diagnoses: Patient Active Problem List   Diagnosis Date Noted  . Hyponatremia 11/03/2017  . Diabetes (HCC) 11/03/2017  . COPD (chronic obstructive pulmonary disease) (HCC) 11/03/2017  . CAD (coronary artery disease) 11/03/2017  . HLD (hyperlipidemia) 11/03/2017  . Chronic diastolic CHF (congestive heart failure) (HCC) 11/03/2017  . GERD (gastroesophageal reflux disease) 11/03/2017  . HTN (hypertension) 11/03/2017    Orientation RESPIRATION BLADDER Height & Weight     Self, Time, Situation, Place  O2(3L o2) Continent Weight: 251 lb 1.6 oz (113.9 kg) Height:  5\' 8"  (172.7 cm)  BEHAVIORAL SYMPTOMS/MOOD NEUROLOGICAL BOWEL NUTRITION STATUS      Continent Diet(Heart healthy/Carb modified)  AMBULATORY STATUS COMMUNICATION OF NEEDS Skin   Extensive Assist Verbally Normal                       Personal Care Assistance Level of Assistance  Bathing, Feeding, Dressing Bathing Assistance: Limited assistance Feeding assistance: Independent Dressing Assistance: Limited assistance     Functional Limitations Info             SPECIAL CARE FACTORS FREQUENCY  PT (By licensed PT)     PT Frequency: Resume PT as previously ordered              Contractures Contractures Info: Not present     Additional Factors Info  Code Status, Allergies, Psychotropic, Insulin Sliding Scale Code Status Info: Full Allergies Info: Ace Inhibitors, Bactrim Sulfamethoxazole-trimethoprim, Doxycycline Hyclate, Hydrocodone, Oxycodone, Sulfanilamide Psychotropic Info: Abilify, Lamictal, Seroquel, Zoloft Insulin Sliding Scale Info: Novolog: 0-5 units QHS and 0-9 units TID with meals       Current Medications (11/04/2017):  This is the current hospital active medication list Current Facility-Administered Medications  Medication Dose Route Frequency Provider Last Rate Last Dose  . acetaminophen (TYLENOL) tablet 650 mg  650 mg Oral Q6H PRN Oralia Manis, MD       Or  . acetaminophen (TYLENOL) suppository 650 mg  650 mg Rectal Q6H PRN Oralia Manis, MD      . amLODipine (NORVASC) tablet 10 mg  10 mg Oral Daily Oralia Manis, MD   10 mg at 11/04/17 1026  . ARIPiprazole (ABILIFY) tablet 20 mg  20 mg Oral Daily Oralia Manis, MD   20 mg at 11/04/17 1030  . aspirin EC tablet 81 mg  81 mg Oral Daily Oralia Manis, MD   81 mg at 11/04/17 1026  . atorvastatin (LIPITOR) tablet 80 mg  80 mg Oral Daily Oralia Manis, MD   80 mg at 11/04/17 1027  . clopidogrel (PLAVIX) tablet 75 mg  75 mg Oral Daily Oralia Manis, MD   75 mg at 11/04/17 1026  . dorzolamide (TRUSOPT) 2 % ophthalmic solution 1 drop  1 drop Right Eye BID Oralia Manis, MD   1 drop at  11/04/17 1031  . heparin injection 5,000 Units  5,000 Units Subcutaneous Tedra CoupeQ8H Oralia ManisWillis, David, MD   5,000 Units at 11/04/17 1343  . insulin aspart (novoLOG) injection 0-5 Units  0-5 Units Subcutaneous QHS Oralia ManisWillis, David, MD      . insulin aspart (novoLOG) injection 0-9 Units  0-9 Units Subcutaneous TID WC Oralia ManisWillis, David, MD   1 Units at 11/04/17 1343  . isosorbide mononitrate (IMDUR) 24 hr tablet 90 mg  90 mg Oral Daily Oralia ManisWillis, David, MD   90 mg at 11/04/17 1026  . lamoTRIgine (LAMICTAL) tablet 150 mg  150 mg Oral Daily Oralia ManisWillis, David, MD   150 mg at 11/04/17 1027  .  metoprolol tartrate (LOPRESSOR) tablet 100 mg  100 mg Oral BID Oralia ManisWillis, David, MD   100 mg at 11/04/17 1026  . mometasone-formoterol (DULERA) 200-5 MCG/ACT inhaler 2 puff  2 puff Inhalation BID Oralia ManisWillis, David, MD   2 puff at 11/04/17 0901  . ondansetron (ZOFRAN) tablet 4 mg  4 mg Oral Q6H PRN Oralia ManisWillis, David, MD       Or  . ondansetron [facility](ZOFRAN) injection 4 mg  4 mg Intravenous Q6H PRN Oralia ManisWillis, David, MD      . pantoprazole (PROTONIX) EC tablet 40 mg  40 mg Oral BID Oralia ManisWillis, David, MD   40 mg at 11/04/17 1026  . QUEtiapine (SEROQUEL) tablet 25 mg  25 mg Oral BID Oralia ManisWillis, David, MD   25 mg at 11/04/17 1027  . QUEtiapine (SEROQUEL) tablet 50 mg  50 mg Oral QHS Oralia ManisWillis, David, MD      . sertraline (ZOLOFT) tablet 200 mg  200 mg Oral Lamont SnowballQHS Willis, David, MD      . tiotropium [facility](SPIRIVA) inhalation capsule 18 mcg  18 mcg Inhalation Daily Oralia ManisWillis, David, MD   18 mcg at 11/04/17 0902     Discharge Medications: Please see discharge summary for a list of discharge medications.  Relevant Imaging Results:  Relevant Lab Results:   Additional Information SS#[id number]  Judi CongKaren M Alberto Pina, LCSW

## 2017-11-04 NOTE — Clinical Social Work Note (Addendum)
Clinical Social Work Assessment  Patient Details  Name: Cathy Guzman MRN: [id number] Date of Birth: Jun 28, 1946  Date of referral:  11/04/17               Reason for consult:  Facility Placement                Permission sought to share information with:  Facility Art therapist granted to share information::  Yes, Verbal Permission Granted  Name::        Agency::  [facility]  Relationship::     Contact Information:     Housing/Transportation Living arrangements for the past 2 months:  [place] of Information:  Patient, Medical Team, Facility Patient Interpreter Needed:  None Criminal Activity/Legal Involvement Pertinent to Current Situation/Hospitalization:  No - Comment as needed Significant Relationships:  Adult Children, Warehouse manager Lives with:  Facility Resident Do you feel safe going back to the place where you live?  Yes Need for family participation in patient care:  No (Coment)  Care giving concerns:  Patient admitted from [facility]   Social Worker assessment / plan:  CSW met with the patient at bedside to discuss discharge planning. The patient confirmed that she is a STR resident at [facility] and plans to return when stable. [facility] for [facility].  Joseph at [facility] confirms that the patient can return when stable. The patient will discharge tomorrow or Monday. The patient's PASRR is expired as of today; however, [facility] has already begun the process to update the PASRR according to [facility] MUST website. CSW will continue to follow for discharge facilitation.  Employment status:  Retired Forensic scientist:  Medicare PT Recommendations:  Not assessed at this time Information / Referral to community resources:     Patient/Family's Response to care:  The patient thanked the CSW  Patient/Family's Understanding of and Emotional Response to Diagnosis, Current Treatment, and Prognosis:  The  patient understands and agrees with the discharge plan.  Emotional Assessment Appearance:  Appears stated age Attitude/Demeanor/Rapport:  Guarded Affect (typically observed):  Pleasant Orientation:  Oriented to Self, Oriented to Place, Oriented to  Time, Oriented to Situation Alcohol / Substance use:  Never Used Psych involvement (Current and /or in the community):  No (Comment)  Discharge Needs  Concerns to be addressed:  Care Coordination, Discharge Planning Concerns Readmission within the last 30 days:  No Current discharge risk:  Chronically ill Barriers to Discharge:  Continued Medical Work up   [employer], LCSW 11/04/2017, 3:22 PM

## 2017-11-05 ENCOUNTER — Inpatient Hospital Stay: Payer: Medicare Other

## 2017-11-05 LAB — GLUCOSE, CAPILLARY
Glucose-Capillary: 106 mg/dL — ABNORMAL HIGH (ref 65–99)
Glucose-Capillary: 116 mg/dL — ABNORMAL HIGH (ref 65–99)
Glucose-Capillary: 177 mg/dL — ABNORMAL HIGH (ref 65–99)

## 2017-11-05 LAB — TSH: TSH: 2.216 u[IU]/mL (ref 0.350–4.500)

## 2017-11-05 LAB — OSMOLALITY: OSMOLALITY: 285 mosm/kg (ref 275–295)

## 2017-11-05 MED ORDER — POLYETHYLENE GLYCOL 3350 17 G PO PACK
17.0000 g | PACK | Freq: Every day | ORAL | Status: DC
  Administered 2017-11-06 – 2017-11-07 (×2): 17 g via ORAL
  Filled 2017-11-05 (×2): qty 1

## 2017-11-05 MED ORDER — BISACODYL 10 MG RE SUPP
10.0000 mg | Freq: Once | RECTAL | Status: AC
  Administered 2017-11-05: 10 mg via RECTAL
  Filled 2017-11-05: qty 1

## 2017-11-05 MED ORDER — SODIUM CHLORIDE 0.9 % IV SOLN
INTRAVENOUS | Status: DC
Start: 2017-11-05 — End: 2017-11-08
  Administered 2017-11-06 – 2017-11-07 (×2): via INTRAVENOUS

## 2017-11-05 MED ORDER — FLEET ENEMA 7-19 GM/118ML RE ENEM
1.0000 | ENEMA | RECTAL | Status: DC | PRN
  Administered 2017-11-05 (×2): 1 via RECTAL
  Filled 2017-11-05 (×2): qty 1

## 2017-11-05 MED ORDER — ALBUTEROL SULFATE (2.5 MG/3ML) 0.083% IN NEBU: 2.5000 mg | INHALATION_SOLUTION | Freq: Four times a day (QID) | RESPIRATORY_TRACT | Status: DC | PRN

## 2017-11-05 NOTE — Progress Notes (Signed)
I was alerted by Aram Beechamynthia that she was having lower left quadrant pain so I gave her Tylenol which did not work so I paged Dr. Allena KatzPatel and she suggested a abdominal xray. She also wanted a suppository and prn fleet enema. I gave the suppository and waited but no return so I gave the enema with no return so I gave the pt a prune juice, apple juice, and butter cocktail and no return. I then gave the pt another fleet enema and I will continue to monitor her.

## 2017-11-05 NOTE — Consult Note (Signed)
[facility] CONSULT NOTE    Date: 11/05/2017                  Patient Name:  Cathy Guzman  MRN: [id number]  DOB: 09/07/1946  Age / Sex: 72 y.o., female         PCP: Juluis Pitch, MD                 Service Requesting Consult: Hospitalist                 Reason for Consult: Hyponatremia, acute renal failure, CKD stage IV            History of Present Illness: Patient is a 72 y.o. female with a PMHx of prior myocardial function, bipolar disorder, chronic constipation, GERD, hypertension, gout, diastolic heart failure, iron deficiency anemia, history of chronic bronchitis, diabetes mellitus type 2, who was admitted to [facility] on 11/03/2017 for evaluation of progressive weakness.  She was recently admitted to [facility] for chest pain and ST elevation myocardial infarction.  Subsequently she went to a rehabilitation facility.  Upon being brought here she was found to be significantly hyponatremic.  She was noted to be on Lasix 40 mg p.o. twice daily and was also recently started on Zaroxolyn.  Her presenting serum sodium was 119 and it remains low.  In addition she appears to be on a number of psychotropic medications however the most recent medication change appears to be Zaroxolyn.  Kidney function also appears to be considerably worse at the moment.  When she was discharged from [facility] on October 06, 2017 her creatinine was 2.95 with an EGFR of 16.  Medications: Outpatient medications: Medications Prior to Admission  Medication Sig Dispense Refill Last Dose  . albuterol (PROVENTIL) (2.5 MG/3ML) 0.083% nebulizer solution Take 2.5 mg by nebulization every 4 (four) hours as needed for wheezing or shortness of breath.   unknown at unknown  . allopurinol (ZYLOPRIM) 100 MG tablet Take 100 mg by mouth daily.   unknown at unknown  . amLODipine (NORVASC) 10 MG tablet Take 10 mg by mouth daily.   unknown at unknown  . ARIPiprazole (ABILIFY) 20 MG tablet Take 20  mg by mouth daily.   unknown at unknown  . aspirin EC 81 MG tablet Take 81 mg by mouth daily.   unknown at unknown  . atorvastatin (LIPITOR) 80 MG tablet Take 80 mg by mouth daily.   unknown at unknown  . clopidogrel (PLAVIX) 75 MG tablet Take 75 mg by mouth daily.   unknown at unknown  . diclofenac sodium (VOLTAREN) 1 % GEL Apply 2 g topically 4 (four) times daily.   unknown at unknown  . dorzolamide (TRUSOPT) 2 % ophthalmic solution Place 1 drop into the right eye 2 (two) times daily.   unknown at unknown  . ferrous sulfate 325 (65 FE) MG EC tablet Take 325 mg by mouth daily with breakfast.   unknown at unknown  . fluticasone (FLONASE) 50 MCG/ACT nasal spray Place 1 spray into both nostrils daily.   unknown at unknown  . Fluticasone-Salmeterol (ADVAIR) 500-50 MCG/DOSE AEPB Inhale 1 puff into the lungs 2 (two) times daily.   unknown at unknown  . furosemide (LASIX) 20 MG tablet Take 20 mg by mouth 2 (two) times daily.   unknown at unknown  . insulin glargine (LANTUS) 100 UNIT/ML injection Inject 17 Units into the skin at bedtime.   unknown at unknown  . isosorbide  mononitrate (IMDUR) 30 MG 24 hr tablet Take 90 mg by mouth daily.   unknown at unknown  . lamoTRIgine (LAMICTAL) 100 MG tablet Take 150 mg by mouth daily.   unknown at unknown  . Melatonin 5 MG TABS Take 10 mg by mouth at bedtime as needed (sleep).   unknown at unknown  . metolazone (ZAROXOLYN) 2.5 MG tablet Take 2.5 mg by mouth daily.   unknown at unknown  . metoprolol tartrate (LOPRESSOR) 50 MG tablet Take 100 mg by mouth 2 (two) times daily.   unknown at unknown  . Omega 3 1000 MG CAPS Take 1,000 mg by mouth every evening.   unknown at unknown  . omeprazole (PRILOSEC) 20 MG capsule Take 40 mg by mouth 2 (two) times daily before a meal.   unknown at unknown  . QUEtiapine (SEROQUEL) 25 MG tablet Take 25 mg by mouth 2 (two) times daily.   unknown at unknown  . QUEtiapine (SEROQUEL) 50 MG tablet Take 50 mg by mouth at bedtime.   unknown  at unknown  . senna (SENOKOT) 8.6 MG TABS tablet Take 2 tablets by mouth at bedtime as needed for mild constipation.   unknown at unknown  . sertraline (ZOLOFT) 100 MG tablet Take 200 mg by mouth at bedtime.   unknown at unknown  . sucralfate (CARAFATE) 1 g tablet Take 1 g by mouth 4 (four) times daily.   unknown at unknown  . tiotropium (SPIRIVA) 18 MCG inhalation capsule Place 18 mcg into inhaler and inhale daily.   unknown at unknown    Current medications: Current Facility-Administered Medications  Medication Dose Route Frequency Provider Last Rate Last Dose  . acetaminophen (TYLENOL) tablet 650 mg  650 mg Oral Q6H PRN Lance Coon, MD   650 mg at 11/05/17 1610   Or  . acetaminophen (TYLENOL) suppository 650 mg  650 mg Rectal Q6H PRN Lance Coon, MD      . albuterol (PROVENTIL) (2.5 MG/3ML) 0.083% nebulizer solution 2.5 mg  2.5 mg Nebulization Q6H PRN Fritzi Mandes, MD      . amLODipine (NORVASC) tablet 10 mg  10 mg Oral Daily Lance Coon, MD   10 mg at 11/05/17 1224  . ARIPiprazole (ABILIFY) tablet 20 mg  20 mg Oral Daily Lance Coon, MD   20 mg at 11/05/17 1227  . aspirin EC tablet 81 mg  81 mg Oral Daily Lance Coon, MD   81 mg at 11/05/17 1222  . atorvastatin (LIPITOR) tablet 80 mg  80 mg Oral Daily Lance Coon, MD   80 mg at 11/05/17 1222  . clopidogrel (PLAVIX) tablet 75 mg  75 mg Oral Daily Lance Coon, MD   75 mg at 11/05/17 1222  . dorzolamide (TRUSOPT) 2 % ophthalmic solution 1 drop  1 drop Right Eye BID Lance Coon, MD   1 drop at 11/05/17 1227  . heparin injection 5,000 Units  5,000 Units Subcutaneous Camelia Phenes Lance Coon, MD   5,000 Units at 11/05/17 1352  . insulin aspart (novoLOG) injection 0-5 Units  0-5 Units Subcutaneous QHS Lance Coon, MD      . insulin aspart (novoLOG) injection 0-9 Units  0-9 Units Subcutaneous TID WC Lance Coon, MD   2 Units at 11/05/17 1731  . isosorbide mononitrate (IMDUR) 24 hr tablet 90 mg  90 mg Oral Daily Lance Coon, MD   90  mg at 11/05/17 1222  . lamoTRIgine (LAMICTAL) tablet 150 mg  150 mg Oral Daily Lance Coon, MD   150  mg at 11/05/17 1223  . metoprolol tartrate (LOPRESSOR) tablet 100 mg  100 mg Oral BID Lance Coon, MD   100 mg at 11/04/17 2130  . mometasone-formoterol (DULERA) 200-5 MCG/ACT inhaler 2 puff  2 puff Inhalation BID Lance Coon, MD   2 puff at 11/05/17 [phone]  . ondansetron (ZOFRAN) tablet 4 mg  4 mg Oral Q6H PRN Lance Coon, MD       Or  . ondansetron [facility]) injection 4 mg  4 mg Intravenous Q6H PRN Lance Coon, MD      . pantoprazole (PROTONIX) EC tablet 40 mg  40 mg Oral BID Lance Coon, MD   40 mg at 11/05/17 1223  . polyethylene glycol (MIRALAX / GLYCOLAX) packet 17 g  17 g Oral Daily Fritzi Mandes, MD      . QUEtiapine (SEROQUEL) tablet 25 mg  25 mg Oral BID Lance Coon, MD   25 mg at 11/05/17 1222  . QUEtiapine (SEROQUEL) tablet 50 mg  50 mg Oral Corwin Levins, MD   50 mg at 11/04/17 2116  . sertraline (ZOLOFT) tablet 200 mg  200 mg Oral Corwin Levins, MD   200 mg at 11/04/17 2114  . sodium phosphate (FLEET) 7-19 GM/118ML enema 1 enema  1 enema Rectal PRN Fritzi Mandes, MD   1 enema at 11/05/17 1757  . tiotropium (SPIRIVA) inhalation capsule 18 mcg  18 mcg Inhalation Daily Lance Coon, MD   18 mcg at 11/05/17 2683      Allergies: Allergies  Allergen Reactions  . Ace Inhibitors   . Bactrim [Sulfamethoxazole-Trimethoprim]   . Doxycycline Hyclate   . Hydrocodone   . Oxycodone   . Sulfanilamide       Past Medical History: Past Medical History:  Diagnosis Date  . Acute myocardial infarction, subendocardial infarction, initial episode of care ([place])   . 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 Atopic rhinitis   . Avitaminosis D   . Bacteremia   . Better eye: severe vision impairment; lesser eye: blind   . Bipolar disorder, unspecified ([place])   . Cannot walk   . CN (constipation)   . Coronary atherosclerosis of native coronary artery   . Encounter for vocational therapy   .  Esophageal reflux   . Essential hypertension, malignant   . Gout, unspecified   . Heart failure, diastolic ([place])   . Hyperlipemia   . Hypomagnesemia   . Hypopotassemia   . Iron deficiency anemia, unspecified   . Muscle weakness (generalized)   . Obstructive chronic bronchitis without exacerbation ([place])   . Oropharyngeal dysphagia   . Persistent disorder of initiating or maintaining sleep   . Rash and other nonspecific skin eruption   . Scar painful   . Scissor gait   . Shortness of breath   . Spasm of muscle   . Type II diabetes mellitus with renal manifestations ([place])   . Urinary tract infection, site not specified      Past Surgical History: Past Surgical History:  Procedure Laterality Date  . ABDOMINAL HYSTERECTOMY    . ABDOMINAL SURGERY    . CHOLECYSTECTOMY    . GASTRIC BYPASS       Family History: Family History  Problem Relation Age of Onset  . CAD Mother   . Glaucoma Mother   . COPD Father   . COPD Brother   . Parkinsonism Brother      Social History: Social History   Socioeconomic History  . Marital status: Divorced  Spouse name: Not on file  . Number of children: Not on file  . Years of education: Not on file  . Highest education level: Not on file  Social Needs  . Financial resource strain: Not on file  . Food insecurity - worry: Not on file  . Food insecurity - inability: Not on file  . Transportation needs - medical: Not on file  . Transportation needs - non-medical: Not on file  Occupational History  . Not on file  Tobacco Use  . Smoking status: Never Smoker  . Smokeless tobacco: Never Used  Substance and Sexual Activity  . Alcohol use: No    Frequency: Never  . Drug use: No  . Sexual activity: Not on file  Other Topics Concern  . Not on file  Social History Narrative  . Not on file     Review of Systems: As per HPI  Vital Signs: Blood pressure (!) 133/59, pulse 63, temperature (!) 97.4 F (36.3 C), temperature source  Oral, resp. rate 20, height 5' 8"  (1.727 m), weight 119.5 kg (263 lb 8 oz), SpO2 97 %.  Weight trends: Filed Weights   11/03/17 2000 11/04/17 0429 11/05/17 0500  Weight: 121.6 kg (268 lb) 113.9 kg (251 lb 1.6 oz) 119.5 kg (263 lb 8 oz)    Physical Exam: General: Obese female, no acute distress  Head: Normocephalic, atraumatic.  Eyes: Anicteric, EOMI  Nose: Mucous membranes moist, not inflammed, nonerythematous.  Throat: Oropharynx nonerythematous, no exudate appreciated.   Neck: Supple, trachea midline.  Lungs:  Normal respiratory effort. Clear to auscultation BL without crackles or wheezes.  Heart: RRR. S1 and S2 normal without gallop, murmur, or rubs.  Abdomen:  BS normoactive. Soft, Nondistended, non-tender.  No masses or organomegaly.  Extremities: trace pretibial edema.  Neurologic: Awake, alert, oriented to self, time, place, followed commands  Skin: No visible rashes, scars.    Lab results: Basic Metabolic Panel: Recent Labs  Lab 11/03/17 2050 11/04/17 0703 11/04/17 2131  NA 119* 120* 119*  K 3.8 3.4*  --   CL 77* 80*  --   CO2 31 31  --   GLUCOSE 131* 114*  --   BUN 65* 65*  --   CREATININE 3.36* 3.28*  --   CALCIUM 8.7* 8.4*  --     Liver Function Tests: Recent Labs  Lab 11/03/17 2050  AST 13*  ALT 11*  ALKPHOS 89  BILITOT 0.5  PROT 6.2*  ALBUMIN 3.1*   No results for input(s): LIPASE, AMYLASE in the last 168 hours. No results for input(s): AMMONIA in the last 168 hours.  CBC: Recent Labs  Lab 11/03/17 2050 11/04/17 0703  WBC 6.9 6.3  HGB 9.4* 9.0*  HCT 26.7* 26.7*  MCV 87.9 88.0  PLT 220 186    Cardiac Enzymes: No results for input(s): CKTOTAL, CKMB, CKMBINDEX, TROPONINI in the last 168 hours.  BNP: Invalid input(s): POCBNP  CBG: Recent Labs  Lab 11/04/17 1329 11/04/17 1643 11/04/17 2105 11/05/17 0752 11/05/17 1152  GLUCAP 135* 113* 142* 106* 116*    Microbiology: No results found for this or any previous  visit.  Coagulation Studies: No results for input(s): LABPROT, INR in the last 72 hours.  Urinalysis: Recent Labs    11/03/17 2050  COLORURINE YELLOW*  LABSPEC 1.009  PHURINE 6.0  GLUCOSEU NEGATIVE  HGBUR NEGATIVE  BILIRUBINUR NEGATIVE  KETONESUR NEGATIVE  PROTEINUR NEGATIVE  NITRITE NEGATIVE  LEUKOCYTESUR NEGATIVE      Imaging: Dg Abd 1  View  Result Date: 11/05/2017 CLINICAL DATA:  72 year old female with a history of abdominal pain and nausea EXAM: ABDOMEN - 1 VIEW COMPARISON:  None. FINDINGS: Portions of the abdomen have been excluded from the exam. Gas present within stomach, small bowel, colon. No abnormally distended small bowel or colon. No significant formed stool burden. No unexpected soft tissue density.  No unexpected calcifications. Vascular calcifications of the mesenteric vasculature, lower aorta and iliac arteries. Surgical clips within the right abdomen and pelvis, and the epigastric region. No displaced fracture. IMPRESSION: Nonobstructive bowel gas pattern. Atherosclerosis. Electronically Signed   By: Corrie Mckusick D.O.   On: 11/05/2017 11:47      Assessment & Plan: Pt is a 72 y.o. female with a PMHx of prior myocardial function, bipolar disorder, chronic constipation, GERD, hypertension, gout, diastolic heart failure, iron deficiency anemia, history of chronic bronchitis, diabetes mellitus type 2, chronic kidney disease stage IV baseline EGFR 16who was admitted to [facility] on 11/03/2017 for evaluation of progressive weakness.   1.  Acute renal failure/chronic kidney disease stage IV baseline creatinine 2.95 with a EGFR of 19.  Renal function appears to be considerably worse at the moment as compared to her recent discharge.  She has been on Lasix as well as Zaroxolyn.  Suspect that there is some element of intravascular volume depletion.  At this time we will restart 0.9 normal saline however at a low rate at 40 cc/h.  Recheck renal function tomorrow.  We will also  check SPEP and UPEP as well as urine protein to creatinine ratio.  Patient is also on a number of psychotropic medications however I doubt that these are causing her hyponatremia now.  2.  Acute hyponatremia.  Serum sodium considerably lower than when she was at [facility] on October 06, 2017.  Upon discharge her serum sodium was 141.  Serum sodium now is 119.  She was on Zaroxolyn as well as Lasix.  These have been held now.  Restart the patient on IV fluid hydration with 0.9 normal saline at 40 cc/h given the fact that she does have underlying history of heart failure.   3.  Anemia of chronic kidney disease.  Hemoglobin low at 9.0.  We may need to consider Epogen if hemoglobin continues to drop.

## 2017-11-05 NOTE — Progress Notes (Signed)
[facility] - [place] [facility]   PATIENT NAME: Cathy Guzman    MR#:  [id number]  DATE OF BIRTH:  04/06/1946  SUBJECTIVE:   Patient was seen in with abnormal labs which was done for her workup of progressive weakness.  She was found to have low sodium.   Patient has not had a bowel movement for 4 days.  Having some left lower quadrant abdominal pain. She is intermittently pleasantly confused. REVIEW OF SYSTEMS:   Review of Systems  Constitutional: Negative for chills, fever and weight loss.  HENT: Negative for ear discharge, ear pain and nosebleeds.   Eyes: Negative for blurred vision, pain and discharge.  Respiratory: Negative for sputum production, shortness of breath, wheezing and stridor.   Cardiovascular: Negative for chest pain, palpitations, orthopnea and PND.  Gastrointestinal: Negative for abdominal pain, diarrhea, nausea and vomiting.  Genitourinary: Negative for frequency and urgency.  Musculoskeletal: Negative for back pain and joint pain.  Neurological: Negative for sensory change, speech change, focal weakness and weakness.  Psychiatric/Behavioral: Negative for depression and hallucinations. The patient is not nervous/anxious.    Tolerating Diet:yes Tolerating PT: bed bound--chronic  DRUG ALLERGIES:   Allergies  Allergen Reactions  . Ace Inhibitors   . Bactrim [Sulfamethoxazole-Trimethoprim]   . Doxycycline Hyclate   . Hydrocodone   . Oxycodone   . Sulfanilamide     VITALS:  Blood pressure 128/61, pulse 61, temperature (!) 97.5 F (36.4 C), temperature source Oral, resp. rate 14, height 5\' 8"  (1.727 m), weight 119.5 kg (263 lb 8 oz), SpO2 100 %.  PHYSICAL EXAMINATION:   Physical Exam  GENERAL:  72 y.o.-year-old patient lying in the bed with no acute distress. Obese EYES: Pupils equal, round, reactive to light and accommodation. No scleral icterus. Extraocular muscles intact.  HEENT: Head atraumatic, normocephalic. Oropharynx  and nasopharynx clear.  NECK:  Supple, no jugular venous distention. No thyroid enlargement, no tenderness.  LUNGS: decreased breath sounds bilaterally, no wheezing, rales, rhonchi. No use of accessory muscles of respiration.  CARDIOVASCULAR: S1, S2 normal. No murmurs, rubs, or gallops.  ABDOMEN: Soft, nontender, nondistended. Bowel sounds present. No organomegaly or mass.  EXTREMITIES: No cyanosis, clubbing or edema b/l.    NEUROLOGIC: Cranial nerves II through XII are intact. No focal Motor or sensory deficits b/l.   PSYCHIATRIC:  patient is alert and oriented x 3.  SKIN: No obvious rash, lesion, or ulcer.   LABORATORY PANEL:  CBC Recent Labs  Lab 11/04/17 0703  WBC 6.3  HGB 9.0*  HCT 26.7*  PLT 186    Chemistries  Recent Labs  Lab 11/03/17 2050 11/04/17 0703 11/04/17 2131  NA 119* 120* 119*  K 3.8 3.4*  --   CL 77* 80*  --   CO2 31 31  --   GLUCOSE 131* 114*  --   BUN 65* 65*  --   CREATININE 3.36* 3.28*  --   CALCIUM 8.7* 8.4*  --   AST 13*  --   --   ALT 11*  --   --   ALKPHOS 89  --   --   BILITOT 0.5  --   --    Cardiac Enzymes No results for input(s): TROPONINI in the last 168 hours. RADIOLOGY:  Dg Abd 1 View  Result Date: 11/05/2017 CLINICAL DATA:  72 year old female with a history of abdominal pain and nausea EXAM: ABDOMEN - 1 VIEW COMPARISON:  None. FINDINGS: Portions of the abdomen have been excluded from the  exam. Gas present within stomach, small bowel, colon. No abnormally distended small bowel or colon. No significant formed stool burden. No unexpected soft tissue density.  No unexpected calcifications. Vascular calcifications of the mesenteric vasculature, lower aorta and iliac arteries. Surgical clips within the right abdomen and pelvis, and the epigastric region. No displaced fracture. IMPRESSION: Nonobstructive bowel gas pattern. Atherosclerosis. Electronically Signed   By: Gilmer Mor D.O.   On: 11/05/2017 11:47   ASSESSMENT AND PLAN:  Cathy Guzman  is a 72 y.o. female who presents with around 1 week of progressive weakness.  Patient was admitted about a month ago at [facility] for chest pain and had NSTEMI.  She was discharged after treatment to rehab facility.  While there she was started on Zaroxolyn in addition to lasix  In the timeframe just before her current symptoms started  1. Acute Hyponatremia  -strong suspicion for lasix/metalozone  induced hyponatremia with strong correlation between timing of medication initiation and onset of symptoms. -Patient is on high doses of Seroquel and Zoloft for her bipolar disorder not sure if that could be contributing to hyponatremia - We will hold this medication while she is here and treat her initially with normal saline.   - cautious with fluid administration given her history of heart failure. - came in  With sodium 119--120--119--nephrology consultation  2. Diabetes (HCC) -sliding scale insulin with corresponding glucose checks  3. COPD (chronic obstructive pulmonary disease) (HCC) -home dose inhalers  4. CAD (coronary artery disease) -continue home meds    5.Chronic diastolic CHF (congestive heart failure) (HCC) -continue home medications, otherwise very cautious fluid administration as above    6.HTN (hypertension) -stable, continue home meds    7.HLD (hyperlipidemia) -Home dose antilipid    8.GERD (gastroesophageal reflux disease) -home dose PPI  9.  History of bipolar disorder.  Patient is on high doses of Seroquel and Zoloft  10.Constipation we will give Dulcolax suppository and enema along with p.o. MiraLAX CSW for d/c planning  Case discussed with Care Management/Social Worker. Management plans discussed with the patient and they are in agreement.  CODE STATUS: FULL  DVT Prophylaxis: lovenox  TOTAL TIME TAKING CARE OF THIS PATIENT: **30 minutes.  >50% time spent on counselling and coordination of care  POSSIBLE D/C IN *1-2 DAYS, DEPENDING ON CLINICAL  CONDITION.  Note: This dictation was prepared with Dragon dictation along with smaller phrase technology. Any transcriptional errors that result from this process are unintentional.  Enedina Finner M.D on 11/05/2017 at 12:41 PM  Between 7am to 6pm - Pager - [phone]  After 6pm go to [email or web address] - Social research officer, government  [facility]  Office  [phone]  CC: Primary care physician; Dorothey Baseman, MDPatient ID: Cathy Guzman, female   DOB: 1946/09/15, 72 y.o.   MRN: [id number]

## 2017-11-06 ENCOUNTER — Inpatient Hospital Stay: Payer: Medicare Other

## 2017-11-06 LAB — GLUCOSE, CAPILLARY
GLUCOSE-CAPILLARY: 122 mg/dL — AB (ref 65–99)
GLUCOSE-CAPILLARY: 162 mg/dL — AB (ref 65–99)
GLUCOSE-CAPILLARY: 161 mg/dL — AB (ref 65–99)
Glucose-Capillary: 167 mg/dL — ABNORMAL HIGH (ref 65–99)
Glucose-Capillary: 88 mg/dL (ref 65–99)

## 2017-11-06 LAB — SODIUM
Sodium: 124 mmol/L — ABNORMAL LOW (ref 135–145)
Sodium: 123 mmol/L — ABNORMAL LOW (ref 135–145)

## 2017-11-06 LAB — CORTISOL: CORTISOL PLASMA: 12.8 ug/dL

## 2017-11-06 MED ORDER — ALUM & MAG HYDROXIDE-SIMETH 200-200-20 MG/5ML PO SUSP
30.0000 mL | Freq: Four times a day (QID) | ORAL | Status: DC | PRN
Start: 2017-11-06 — End: 2017-11-08
  Administered 2017-11-06 – 2017-11-08 (×2): 30 mL via ORAL
  Filled 2017-11-06 (×2): qty 30

## 2017-11-06 NOTE — Progress Notes (Signed)
Loss IV access, informed Dr. Anne HahnWillis.  Supervisor to assist with access, as patient is a known hard stick.

## 2017-11-06 NOTE — Progress Notes (Addendum)
[facility] - [place] [facility]   PATIENT NAME: Cathy Guzman    MR#:  [id number]  DATE OF BIRTH:  1946-05-01  SUBJECTIVE:   Patient was seen in with abnormal labs which was done for her workup of progressive weakness.  She was found to have low sodium.   Pt has had small Bm yday. She is intermittently pleasantly confused. REVIEW OF SYSTEMS:   Review of Systems  Constitutional: Negative for chills, fever and weight loss.  HENT: Negative for ear discharge, ear pain and nosebleeds.   Eyes: Negative for blurred vision, pain and discharge.  Respiratory: Negative for sputum production, shortness of breath, wheezing and stridor.   Cardiovascular: Negative for chest pain, palpitations, orthopnea and PND.  Gastrointestinal: Negative for abdominal pain, diarrhea, nausea and vomiting.  Genitourinary: Negative for frequency and urgency.  Musculoskeletal: Negative for back pain and joint pain.  Neurological: Negative for sensory change, speech change, focal weakness and weakness.  Psychiatric/Behavioral: Negative for depression and hallucinations. The patient is not nervous/anxious.    Tolerating Diet:yes Tolerating PT: bed bound--chronic  DRUG ALLERGIES:   Allergies  Allergen Reactions  . Ace Inhibitors   . Bactrim [Sulfamethoxazole-Trimethoprim]   . Doxycycline Hyclate   . Hydrocodone   . Oxycodone   . Sulfanilamide     VITALS:  Blood pressure 123/62, pulse 63, temperature 97.7 F (36.5 C), resp. rate 18, height 5\' 8"  (1.727 m), weight 119 kg (262 lb 5.6 oz), SpO2 100 %.  PHYSICAL EXAMINATION:   Physical Exam  GENERAL:  72 y.o.-year-old patient lying in the bed with no acute distress. Obese EYES: Pupils equal, round, reactive to light and accommodation. No scleral icterus. Extraocular muscles intact.  HEENT: Head atraumatic, normocephalic. Oropharynx and nasopharynx clear.  NECK:  Supple, no jugular venous distention. No thyroid enlargement, no  tenderness.  LUNGS: decreased breath sounds bilaterally, no wheezing, rales, rhonchi. No use of accessory muscles of respiration.  CARDIOVASCULAR: S1, S2 normal. No murmurs, rubs, or gallops.  ABDOMEN: Soft, nontender, nondistended. Bowel sounds present. No organomegaly or mass.  EXTREMITIES: No cyanosis, clubbing or edema b/l.    NEUROLOGIC: Cranial nerves II through XII are intact. No focal Motor or sensory deficits b/l.   PSYCHIATRIC:  patient is alert and oriented x 3.  SKIN: No obvious rash, lesion, or ulcer.   LABORATORY PANEL:  CBC Recent Labs  Lab 11/04/17 0703  WBC 6.3  HGB 9.0*  HCT 26.7*  PLT 186    Chemistries  Recent Labs  Lab 11/03/17 2050 11/04/17 0703  11/06/17 0935  NA 119* 120*   < > 124*  K 3.8 3.4*  --   --   CL 77* 80*  --   --   CO2 31 31  --   --   GLUCOSE 131* 114*  --   --   BUN 65* 65*  --   --   CREATININE 3.36* 3.28*  --   --   CALCIUM 8.7* 8.4*  --   --   AST 13*  --   --   --   ALT 11*  --   --   --   ALKPHOS 89  --   --   --   BILITOT 0.5  --   --   --    < > = values in this interval not displayed.   Cardiac Enzymes No results for input(s): TROPONINI in the last 168 hours. RADIOLOGY:  Dg Abd 1 View  Result  Date: 11/05/2017 CLINICAL DATA:  72 year old female with a history of abdominal pain and nausea EXAM: ABDOMEN - 1 VIEW COMPARISON:  None. FINDINGS: Portions of the abdomen have been excluded from the exam. Gas present within stomach, small bowel, colon. No abnormally distended small bowel or colon. No significant formed stool burden. No unexpected soft tissue density.  No unexpected calcifications. Vascular calcifications of the mesenteric vasculature, lower aorta and iliac arteries. Surgical clips within the right abdomen and pelvis, and the epigastric region. No displaced fracture. IMPRESSION: Nonobstructive bowel gas pattern. Atherosclerosis. Electronically Signed   By: Gilmer Mor D.O.   On: 11/05/2017 11:47   US Renal  Result  Date: 11/06/2017 CLINICAL DATA:  Acute renal failure. EXAM: RENAL / URINARY TRACT ULTRASOUND COMPLETE COMPARISON:  None. FINDINGS: Right Kidney: Length: 10.1 cm. 2.8 cm simple cyst is noted in midpole. Increased echogenicity of renal parenchyma is noted. No mass or hydronephrosis visualized. Left Kidney: Length: 10.5 cm. Increased echogenicity of renal parenchyma is noted. No mass or hydronephrosis visualized. Bladder: Appears normal for degree of bladder distention. IMPRESSION: Increased echogenicity of renal parenchyma is noted bilaterally suggesting medical renal disease. No hydronephrosis or renal obstruction is noted. Electronically Signed   By: Lupita Raider, M.D.   On: 11/06/2017 09:20   ASSESSMENT AND PLAN:  Cathy Guzman  is a 72 y.o. female who presents with around 1 week of progressive weakness.  Patient was admitted about a month ago at [facility] for chest pain and had NSTEMI.  She was discharged after treatment to rehab facility.  While there she was started on Zaroxolyn in addition to lasix  In the timeframe just before her current symptoms started  1. Acute Hyponatremia  -strong suspicion for lasix/metalozone  induced hyponatremia with strong correlation between timing of medication initiation and onset of symptoms. -Patient is on high doses of Seroquel and Zoloft for her bipolar disorder not sure if that could be contributing to hyponatremia - We will hold this medication while she is here and treat her initially with normal saline.   - cautious with fluid administration given her history of heart failure. - came in  With sodium 119--120--119--nephrology consultation and IVF at 40cc/hour--124  2. Diabetes (HCC) -sliding scale insulin with corresponding glucose checks  3. COPD (chronic obstructive pulmonary disease) (HCC) -home dose inhalers  4. CAD (coronary artery disease) -continue home meds    5.Chronic diastolic CHF (congestive heart failure) (HCC) -continue home  medications, otherwise very cautious fluid administration as above    6.HTN (hypertension) -stable, continue home meds    7.HLD (hyperlipidemia) -Home dose antilipid    8.GERD (gastroesophageal reflux disease) -home dose PPI  9.  History of bipolar disorder.  Patient is on high doses of Seroquel and Zoloft  10.Constipation we will give Dulcolax suppository and enema along with p.o. MiraLAX  CSW for d/c planning. Left message again today with Nyoka Lint patient's daughter to give an update.  Case discussed with Care Management/Social Worker. Management plans discussed with the patient and they are in agreement.  CODE STATUS: FULL  DVT Prophylaxis: lovenox  TOTAL TIME TAKING CARE OF THIS PATIENT: **30 minutes.  >50% time spent on counselling and coordination of care  POSSIBLE D/C IN *1-2 DAYS, DEPENDING ON CLINICAL CONDITION.  Note: This dictation was prepared with Dragon dictation along with smaller phrase technology. Any transcriptional errors that result from this process are unintentional.  Enedina Finner M.D on 11/06/2017 at 12:11 PM  Between 7am to 6pm -  Pager - [phone]  After 6pm go to [email or web address] - Social research officer, governmentpassword EPAS [facility]  [facility]  Office  [phone]  CC: Primary care physician; Dorothey BasemanBronstein, David, MDPatient ID: Larrie Kassynthia Leandro, female   DOB: 12/30/1945, 72 y.o.   MRN: [id number]

## 2017-11-06 NOTE — Progress Notes (Signed)
[facility]  ROUNDING NOTE   Subjective:   Na 124 No new creatinine.   Objective:  Vital signs in last 24 hours:  Temp:  [97.4 F (36.3 C)-97.7 F (36.5 C)] 97.7 F (36.5 C) (01/28 1137) Pulse Rate:  [61-63] 63 (01/28 1137) Resp:  [18-20] 18 (01/28 0443) BP: (123-133)/(51-62) 123/62 (01/28 1137) SpO2:  [97 %-100 %] 100 % (01/28 1137) Weight:  [119 kg (262 lb 5.6 oz)] 119 kg (262 lb 5.6 oz) (01/28 0443)  Weight change: -0.523 kg (-2.4 oz) Filed Weights   11/04/17 0429 11/05/17 0500 11/06/17 0443  Weight: 113.9 kg (251 lb 1.6 oz) 119.5 kg (263 lb 8 oz) 119 kg (262 lb 5.6 oz)    Intake/Output: I/O last 3 completed shifts: In: 240 [P.O.:240] Out: 300 [Urine:300]   Intake/Output this shift:  No intake/output data recorded.  Physical Exam: General: NAD,   Head: Normocephalic, atraumatic. Moist oral mucosal membranes  Eyes: Anicteric, PERRL  Neck: Supple, trachea midline  Lungs:  Clear to auscultation  Heart: Regular rate and rhythm  Abdomen:  Soft, nontender,   Extremities: no peripheral edema.  Neurologic: Nonfocal, moving all four extremities  Skin: No lesions       Basic Metabolic Panel: Recent Labs  Lab 11/03/17 2050 11/04/17 0703 11/04/17 2131 11/06/17 0935  NA 119* 120* 119* 124*  K 3.8 3.4*  --   --   CL 77* 80*  --   --   CO2 31 31  --   --   GLUCOSE 131* 114*  --   --   BUN 65* 65*  --   --   CREATININE 3.36* 3.28*  --   --   CALCIUM 8.7* 8.4*  --   --     Liver Function Tests: Recent Labs  Lab 11/03/17 2050  AST 13*  ALT 11*  ALKPHOS 89  BILITOT 0.5  PROT 6.2*  ALBUMIN 3.1*   No results for input(s): LIPASE, AMYLASE in the last 168 hours. No results for input(s): AMMONIA in the last 168 hours.  CBC: Recent Labs  Lab 11/03/17 2050 11/04/17 0703  WBC 6.9 6.3  HGB 9.4* 9.0*  HCT 26.7* 26.7*  MCV 87.9 88.0  PLT 220 186    Cardiac Enzymes: No results for input(s): CKTOTAL, CKMB, CKMBINDEX, TROPONINI in the last  168 hours.  BNP: Invalid input(s): POCBNP  CBG: Recent Labs  Lab 11/05/17 1624 11/05/17 2149 11/06/17 0729 11/06/17 1138 11/06/17 1706  GLUCAP 167* 177* 88 122* 162*    Microbiology: No results found for this or any previous visit.  Coagulation Studies: No results for input(s): LABPROT, INR in the last 72 hours.  Urinalysis: Recent Labs    11/03/17 2050  [place] 1.009  PHURINE 6.0  GLUCOSEU NEGATIVE  HGBUR NEGATIVE  BILIRUBINUR NEGATIVE  KETONESUR NEGATIVE  PROTEINUR NEGATIVE  NITRITE NEGATIVE  LEUKOCYTESUR NEGATIVE      Imaging: Dg Abd 1 View  Result Date: 11/05/2017 CLINICAL DATA:  72 year old female with a history of abdominal pain and nausea EXAM: ABDOMEN - 1 VIEW COMPARISON:  None. FINDINGS: Portions of the abdomen have been excluded from the exam. Gas present within stomach, small bowel, colon. No abnormally distended small bowel or colon. No significant formed stool burden. No unexpected soft tissue density.  No unexpected calcifications. Vascular calcifications of the mesenteric vasculature, lower aorta and iliac arteries. Surgical clips within the right abdomen and pelvis, and the epigastric region. No displaced fracture. IMPRESSION: Nonobstructive bowel  gas pattern. Atherosclerosis. Electronically Signed   By: Corrie Mckusick D.O.   On: 11/05/2017 11:47   US Renal  Result Date: 11/06/2017 CLINICAL DATA:  Acute renal failure. EXAM: RENAL / URINARY TRACT ULTRASOUND COMPLETE COMPARISON:  None. FINDINGS: Right Kidney: Length: 10.1 cm. 2.8 cm simple cyst is noted in midpole. Increased echogenicity of renal parenchyma is noted. No mass or hydronephrosis visualized. Left Kidney: Length: 10.5 cm. Increased echogenicity of renal parenchyma is noted. No mass or hydronephrosis visualized. Bladder: Appears normal for degree of bladder distention. IMPRESSION: Increased echogenicity of renal parenchyma is noted bilaterally suggesting medical renal disease.  No hydronephrosis or renal obstruction is noted. Electronically Signed   By: Marijo Conception, M.D.   On: 11/06/2017 09:20     Medications:   . sodium chloride 40 mL/hr at 11/06/17 0509   . amLODipine  10 mg Oral Daily  . ARIPiprazole  20 mg Oral Daily  . aspirin EC  81 mg Oral Daily  . atorvastatin  80 mg Oral Daily  . clopidogrel  75 mg Oral Daily  . dorzolamide  1 drop Right Eye BID  . heparin  5,000 Units Subcutaneous Q8H  . insulin aspart  0-5 Units Subcutaneous QHS  . insulin aspart  0-9 Units Subcutaneous TID WC  . isosorbide mononitrate  90 mg Oral Daily  . lamoTRIgine  150 mg Oral Daily  . metoprolol tartrate  100 mg Oral BID  . mometasone-formoterol  2 puff Inhalation BID  . pantoprazole  40 mg Oral BID  . polyethylene glycol  17 g Oral Daily  . QUEtiapine  25 mg Oral BID  . QUEtiapine  50 mg Oral QHS  . sertraline  200 mg Oral QHS  . tiotropium  18 mcg Inhalation Daily   acetaminophen **OR** acetaminophen, albuterol, ondansetron **OR** ondansetron (ZOFRAN) IV, sodium phosphate  Assessment/ Plan:  Ms. Sofie Schendel is a 72 y.o. white female coronary artery disease, bipolar disorder, chronic constipation, GERD, hypertension, gout, diastolic congestive heart failure, iron deficiency anemia,COPD/chronic bronchitis, diabetes mellitus type 2   1.  Acute renal failure on chronic kidney disease stage IV:  baseline creatinine 2.95 with a EGFR of 19 on 10/06/17 Chronic Kidney Disease secondary to diabetes Acute renal failure secondary to overdiuresis - Improving with IV fluids  2. Hyponatremia: secondary to metolazone - improving with IV fluids and holding diuretics.   3. Hypertension: with history of diastolic congestive heart failure. Echo 10/04/17 - monitor volume status. Currently holding diuretics.  - amlodipine, isosorbide mononitrate, metoprolol  4. Anemia of chronic kidney disease: hemoglobin 9 - has not received EPO.   LOS: 3 [place] Laut[place] 1/28/20195:23  PM

## 2017-11-06 NOTE — Progress Notes (Signed)
RN called Nurse supervisor to request assistance with IV placement.  Supervisor advise someone was coming to help.  RN informed Press photographerCharge Nurse and MD of loss of IV access.

## 2017-11-07 LAB — COMPREHENSIVE METABOLIC PANEL
ALK PHOS: 89 U/L (ref 38–126)
ALT: 9 U/L — AB (ref 14–54)
AST: 15 U/L (ref 15–41)
Albumin: 2.9 g/dL — ABNORMAL LOW (ref 3.5–5.0)
Anion gap: 9 (ref 5–15)
BUN: 60 mg/dL — AB (ref 6–20)
CALCIUM: 8.6 mg/dL — AB (ref 8.9–10.3)
CHLORIDE: 82 mmol/L — AB (ref 101–111)
CO2: 32 mmol/L (ref 22–32)
CREATININE: 2.74 mg/dL — AB (ref 0.44–1.00)
GFR calc Af Amer: 19 mL/min — ABNORMAL LOW (ref >60)
GFR calc non Af Amer: 16 mL/min — ABNORMAL LOW (ref >60)
Glucose, Bld: 121 mg/dL — ABNORMAL HIGH (ref 65–99)
Potassium: 3.6 mmol/L (ref 3.5–5.1)
Sodium: 123 mmol/L — ABNORMAL LOW (ref 135–145)
Total Bilirubin: 0.5 mg/dL (ref 0.3–1.2)
Total Protein: 5.9 g/dL — ABNORMAL LOW (ref 6.5–8.1)

## 2017-11-07 LAB — GLUCOSE, CAPILLARY
GLUCOSE-CAPILLARY: 113 mg/dL — AB (ref 65–99)
Glucose-Capillary: 145 mg/dL — ABNORMAL HIGH (ref 65–99)
Glucose-Capillary: 144 mg/dL — ABNORMAL HIGH (ref 65–99)
Glucose-Capillary: 160 mg/dL — ABNORMAL HIGH (ref 65–99)

## 2017-11-07 LAB — CBC
HEMATOCRIT: 26.3 % — AB (ref 35.0–47.0)
HEMOGLOBIN: 8.6 g/dL — AB (ref 12.0–16.0)
MCH: 29.1 pg (ref 26.0–34.0)
MCHC: 32.7 g/dL (ref 32.0–36.0)
MCV: 88.9 fL (ref 80.0–100.0)
PLATELETS: 223 10*3/uL (ref 150–440)
RBC: 2.95 MIL/uL — AB (ref 3.80–5.20)
RDW: 14.8 % — ABNORMAL HIGH (ref 11.5–14.5)
WBC: 6.7 10*3/uL (ref 3.6–11.0)

## 2017-11-07 MED ORDER — TRAMADOL HCL 50 MG PO TABS
50.0000 mg | ORAL_TABLET | Freq: Four times a day (QID) | ORAL | Status: DC | PRN
  Administered 2017-11-07 – 2017-11-08 (×4): 50 mg via ORAL
  Filled 2017-11-07 (×4): qty 1

## 2017-11-07 NOTE — Progress Notes (Signed)
[facility]  ROUNDING NOTE   Subjective:   Na 123 Creatinine 2.74 (3.28)  Objective:  Vital signs in last 24 hours:  Temp:  [97.6 F (36.4 C)-97.9 F (36.6 C)] 97.9 F (36.6 C) (01/29 0436) Pulse Rate:  [57-62] 57 (01/29 1015) Resp:  [20] 20 (01/28 2016) BP: (119-125)/(54-67) 119/54 (01/29 1015) SpO2:  [95 %-98 %] 95 % (01/29 0436) Weight:  [118.7 kg (261 lb 11 oz)] 118.7 kg (261 lb 11 oz) (01/29 0527)  Weight change: -0.3 kg (-10.6 oz) Filed Weights   11/05/17 0500 11/06/17 0443 11/07/17 0527  Weight: 119.5 kg (263 lb 8 oz) 119 kg (262 lb 5.6 oz) 118.7 kg (261 lb 11 oz)    Intake/Output: I/O last 3 completed shifts: In: 5 [P.O.:240; I.V.:722] Out: 600 [Urine:600]   Intake/Output this shift:  Total I/O In: 240 [P.O.:240] Out: -   Physical Exam: General: NAD,   Head: Normocephalic, atraumatic. Moist oral mucosal membranes  Eyes: Anicteric, PERRL  Neck: Supple, trachea midline  Lungs:  Clear to auscultation  Heart: Regular rate and rhythm  Abdomen:  Soft, nontender,   Extremities: no peripheral edema.  Neurologic: Nonfocal, moving all four extremities  Skin: No lesions       Basic Metabolic Panel: Recent Labs  Lab 11/03/17 2050 11/04/17 0703 11/04/17 2131 11/06/17 0935 11/06/17 1809 11/07/17 0816  NA 119* 120* 119* 124* 123* 123*  K 3.8 3.4*  --   --   --  3.6  CL 77* 80*  --   --   --  82*  CO2 31 31  --   --   --  32  GLUCOSE 131* 114*  --   --   --  121*  BUN 65* 65*  --   --   --  60*  CREATININE 3.36* 3.28*  --   --   --  2.74*  CALCIUM 8.7* 8.4*  --   --   --  8.6*    Liver Function Tests: Recent Labs  Lab 11/03/17 2050 11/07/17 0816  AST 13* 15  ALT 11* 9*  ALKPHOS 89 89  BILITOT 0.5 0.5  PROT 6.2* 5.9*  ALBUMIN 3.1* 2.9*   No results for input(s): LIPASE, AMYLASE in the last 168 hours. No results for input(s): AMMONIA in the last 168 hours.  CBC: Recent Labs  Lab 11/03/17 2050 11/04/17 0703 11/07/17 0816   WBC 6.9 6.3 6.7  HGB 9.4* 9.0* 8.6*  HCT 26.7* 26.7* 26.3*  MCV 87.9 88.0 88.9  PLT 220 186 223    Cardiac Enzymes: No results for input(s): CKTOTAL, CKMB, CKMBINDEX, TROPONINI in the last 168 hours.  BNP: Invalid input(s): POCBNP  CBG: Recent Labs  Lab 11/06/17 1138 11/06/17 1706 11/06/17 2113 11/07/17 0753 11/07/17 1152  GLUCAP 122* 162* 161* 113* 145*    Microbiology: No results found for this or any previous visit.  Coagulation Studies: No results for input(s): LABPROT, INR in the last 72 hours.  Urinalysis: No results for input(s): COLORURINE, LABSPEC, PHURINE, GLUCOSEU, HGBUR, BILIRUBINUR, KETONESUR, PROTEINUR, UROBILINOGEN, NITRITE, LEUKOCYTESUR in the last 72 hours.  Invalid input(s): APPERANCEUR    Imaging: US Renal  Result Date: 11/06/2017 CLINICAL DATA:  Acute renal failure. EXAM: RENAL / URINARY TRACT ULTRASOUND COMPLETE COMPARISON:  None. FINDINGS: Right Kidney: Length: 10.1 cm. 2.8 cm simple cyst is noted in midpole. Increased echogenicity of renal parenchyma is noted. No mass or hydronephrosis visualized. Left Kidney: Length: 10.5 cm. Increased echogenicity of renal parenchyma is  noted. No mass or hydronephrosis visualized. Bladder: Appears normal for degree of bladder distention. IMPRESSION: Increased echogenicity of renal parenchyma is noted bilaterally suggesting medical renal disease. No hydronephrosis or renal obstruction is noted. Electronically Signed   By: Marijo Conception, M.D.   On: 11/06/2017 09:20     Medications:   . sodium chloride 75 mL/hr at 11/07/17 1026   . amLODipine  10 mg Oral Daily  . ARIPiprazole  20 mg Oral Daily  . aspirin EC  81 mg Oral Daily  . atorvastatin  80 mg Oral Daily  . clopidogrel  75 mg Oral Daily  . dorzolamide  1 drop Right Eye BID  . heparin  5,000 Units Subcutaneous Q8H  . insulin aspart  0-5 Units Subcutaneous QHS  . insulin aspart  0-9 Units Subcutaneous TID WC  . isosorbide mononitrate  90 mg Oral  Daily  . lamoTRIgine  150 mg Oral Daily  . metoprolol tartrate  100 mg Oral BID  . mometasone-formoterol  2 puff Inhalation BID  . pantoprazole  40 mg Oral BID  . polyethylene glycol  17 g Oral Daily  . QUEtiapine  25 mg Oral BID  . QUEtiapine  50 mg Oral QHS  . sertraline  200 mg Oral QHS  . tiotropium  18 mcg Inhalation Daily   acetaminophen **OR** acetaminophen, albuterol, alum & mag hydroxide-simeth, ondansetron **OR** ondansetron (ZOFRAN) IV, sodium phosphate, traMADol  Assessment/ Plan:  Cathy Guzman is a 72 y.o. white female coronary artery disease, bipolar disorder, chronic constipation, GERD, hypertension, gout, diastolic congestive heart failure, iron deficiency anemia,COPD/chronic bronchitis, diabetes mellitus type 2   1.  Acute renal failure on chronic kidney disease stage IV:  baseline creatinine 2.95 with a EGFR of 19 on 10/06/17 Chronic Kidney Disease secondary to diabetes Acute renal failure secondary to overdiuresis - Improving with IV fluids  2. Hyponatremia: secondary to metolazone - improving with IV fluids and holding diuretics.  - do not recommend salt tablets.   3. Hypertension: with history of diastolic congestive heart failure. Echo 10/04/17. Blood pressure at goal.  - monitor volume status. Currently holding diuretics.  - amlodipine, isosorbide mononitrate, metoprolol  4. Anemia of chronic kidney disease: hemoglobin 8.6 - has not received EPO.   LOS: 4 Corley Maffeo 1/29/20194:23 PM

## 2017-11-07 NOTE — Progress Notes (Signed)
[facility] [place] [facility]   PATIENT NAME: Cathy Guzman    MR#:  [id number]  DATE OF BIRTH:  05/12/1946  SUBJECTIVE:   Patient was seen in with abnormal labs which was done for her workup of progressive weakness.  She was found to have low sodium.   She is intermittently pleasantly confused. REVIEW OF SYSTEMS:   Review of Systems  Constitutional: Negative for chills, fever and weight loss.  HENT: Negative for ear discharge, ear pain and nosebleeds.   Eyes: Negative for blurred vision, pain and discharge.  Respiratory: Negative for sputum production, shortness of breath, wheezing and stridor.   Cardiovascular: Negative for chest pain, palpitations, orthopnea and PND.  Gastrointestinal: Negative for abdominal pain, diarrhea, nausea and vomiting.  Genitourinary: Negative for frequency and urgency.  Musculoskeletal: Negative for back pain and joint pain.  Neurological: Negative for sensory change, speech change, focal weakness and weakness.  Psychiatric/Behavioral: Negative for depression and hallucinations. The patient is not nervous/anxious.    Tolerating Diet:yes Tolerating PT: bed bound--chronic  DRUG ALLERGIES:   Allergies  Allergen Reactions  . Ace Inhibitors   . Bactrim [Sulfamethoxazole-Trimethoprim]   . Doxycycline Hyclate   . Hydrocodone   . Oxycodone   . Sulfanilamide     VITALS:  Blood pressure (!) 119/54, pulse (!) 57, temperature 97.9 F (36.6 C), resp. rate 20, height 5\' 8"  (1.727 m), weight 118.7 kg (261 lb 11 oz), SpO2 95 %.  PHYSICAL EXAMINATION:   Physical Exam  GENERAL:  72 y.o.-year-old patient lying in the bed with no acute distress. Obese EYES: Pupils equal, round, reactive to light and accommodation. No scleral icterus. Extraocular muscles intact.  HEENT: Head atraumatic, normocephalic. Oropharynx and nasopharynx clear.  NECK:  Supple, no jugular venous distention. No thyroid enlargement, no tenderness.  LUNGS:  decreased breath sounds bilaterally, no wheezing, rales, rhonchi. No use of accessory muscles of respiration.  CARDIOVASCULAR: S1, S2 normal. No murmurs, rubs, or gallops.  ABDOMEN: Soft, nontender, nondistended. Bowel sounds present. No organomegaly or mass.  EXTREMITIES: No cyanosis, clubbing or edema b/l.    NEUROLOGIC: Cranial nerves II through XII are intact. No focal Motor or sensory deficits b/l.   PSYCHIATRIC:  patient is alert and oriented x 3.  SKIN: No obvious rash, lesion, or ulcer.   LABORATORY PANEL:  CBC Recent Labs  Lab 11/07/17 0816  WBC 6.7  HGB 8.6*  HCT 26.3*  PLT 223    Chemistries  Recent Labs  Lab 11/07/17 0816  NA 123*  K 3.6  CL 82*  CO2 32  GLUCOSE 121*  BUN 60*  CREATININE 2.74*  CALCIUM 8.6*  AST 15  ALT 9*  ALKPHOS 89  BILITOT 0.5   Cardiac Enzymes No results for input(s): TROPONINI in the last 168 hours. RADIOLOGY:  [place] Renal  Result Date: 11/06/2017 CLINICAL DATA:  Acute renal failure. EXAM: RENAL / URINARY TRACT ULTRASOUND COMPLETE COMPARISON:  None. FINDINGS: Right Kidney: Length: 10.1 cm. 2.8 cm simple cyst is noted in midpole. Increased echogenicity of renal parenchyma is noted. No mass or hydronephrosis visualized. Left Kidney: Length: 10.5 cm. Increased echogenicity of renal parenchyma is noted. No mass or hydronephrosis visualized. Bladder: Appears normal for degree of bladder distention. IMPRESSION: Increased echogenicity of renal parenchyma is noted bilaterally suggesting medical renal disease. No hydronephrosis or renal obstruction is noted. Electronically Signed   By: Lupita RaiderJames  Green Jr, M.D.   On: 11/06/2017 09:20   ASSESSMENT AND PLAN:  Cathy Guzman  is a 72 y.o. female who presents with around 1 week of progressive weakness.  Patient was admitted about a month ago at [facility] for chest pain and had NSTEMI.  She was discharged after treatment to rehab facility.  While there she was started on Zaroxolyn in addition to lasix  In  the timeframe just before her current symptoms started  1. Acute Hyponatremia  -strong suspicion for lasix/metalozone  induced hyponatremia with strong correlation between timing of medication initiation and onset of symptoms. -Patient is on high doses of Seroquel and Zoloft for her bipolar disorder not sure if that could be contributing to hyponatremia - We will hold this medication while she is here and treat her initially with normal saline.   - cautious with fluid administration given her history of heart failure. - came in  With sodium 119--120--119--nephrology consultation and IVF at 40cc/hour--124--123 -Patient asymptomatic  2. Diabetes (HCC) -sliding scale insulin with corresponding glucose checks  3. COPD (chronic obstructive pulmonary disease) (HCC) -home dose inhalers  4. CAD (coronary artery disease) -continue home meds    5.Chronic diastolic CHF (congestive heart failure) (HCC) -continue home medications, otherwise very cautious fluid administration as above    6.HTN (hypertension) -stable, continue home meds    7.HLD (hyperlipidemia) -Home dose antilipid    8.GERD (gastroesophageal reflux disease) -home dose PPI  9.  History of bipolar disorder.  Patient is on high doses of Seroquel and Zoloft  10.Constipation we will give Dulcolax suppository and enema along with p.o. MiraLAX  CSW for d/c planning. Spoke with daughter Arline Asp yesterday on the phone  Case discussed with Care Management/Social Worker. Management plans discussed with the patient and they are in agreement.  CODE STATUS: FULL  DVT Prophylaxis: lovenox  TOTAL TIME TAKING CARE OF THIS PATIENT: **30 minutes.  >50% time spent on counselling and coordination of care  POSSIBLE D/C IN *1-2 DAYS, DEPENDING ON CLINICAL CONDITION.  Note: This dictation was prepared with Dragon dictation along with smaller phrase technology. Any transcriptional errors that result from this process are unintentional.  Enedina Finner M.D on 11/07/2017 at 2:38 PM  Between 7am to 6pm - Pager - [phone]  After 6pm go to [email or web address] - Social research officer, government  [facility]  Office  [phone]  CC: Primary care physician; Dorothey Baseman, MDPatient ID: Cathy Guzman, female   DOB: 1946/02/18, 72 y.o.   MRN: [id number]

## 2017-11-07 NOTE — Care Management Important Message (Signed)
Important Message  Patient Details  Name: Cathy Guzman MRN: [id number] Date of Birth: 05/03/1946   Medicare Important Message Given:  No(Phone call to daughter Ms Yetta BarreJones to reivew.  Voicemail was full.  Will attempt at a later time )    Chapman FitchBOWEN, Jadalee Westcott T, RN 11/07/2017, 2:52 PM

## 2017-11-07 NOTE — Progress Notes (Signed)
Notified Dr. Anne HahnWillis patient was requesting for Maalox. New order put in at this time.

## 2017-11-08 LAB — PROTEIN ELECTROPHORESIS, SERUM
A/G RATIO SPE: 1.2 (ref 0.7–1.7)
ALPHA-2-GLOBULIN: 1 g/dL (ref 0.4–1.0)
Albumin ELP: 3 g/dL (ref 2.9–4.4)
Alpha-1-Globulin: 0.3 g/dL (ref 0.0–0.4)
BETA GLOBULIN: 0.8 g/dL (ref 0.7–1.3)
GAMMA GLOBULIN: 0.4 g/dL (ref 0.4–1.8)
Globulin, Total: 2.5 g/dL (ref 2.2–3.9)
Total Protein ELP: 5.5 g/dL — ABNORMAL LOW (ref 6.0–8.5)

## 2017-11-08 LAB — SODIUM: Sodium: 125 mmol/L — ABNORMAL LOW (ref 135–145)

## 2017-11-08 LAB — GLUCOSE, CAPILLARY: GLUCOSE-CAPILLARY: 114 mg/dL — AB (ref 65–99)

## 2017-11-08 MED ORDER — INSULIN GLARGINE 100 UNIT/ML ~~LOC~~ SOLN: 10.0000 [IU] | Freq: Every day | SUBCUTANEOUS | 11 refills | Status: AC

## 2017-11-08 NOTE — Progress Notes (Addendum)
Patient is medically stable for D/C back to [facility] today. Per Jomarie LongsJoseph [facility] liaison patient can return today to room 503. RN will call report to 500 hall nurse at [phone] and arrange EMS for transport. Clinical Child psychotherapistocial Worker (CSW) sent D/C orders to [facility] via [facility]. Patient is aware of above. CSW left patient's daughter Arline AspCindy a Engineer, technical salesvoicemail. Please reconsult if future social work needs arise. CSW signing off.   Patient's daughter Arline AspCindy called CSW back and was made aware of above.   Baker Hughes IncorporatedBailey Darienne Belleau, LCSW [phone]

## 2017-11-08 NOTE — Discharge Summary (Addendum)
[facility] - [place] at [facility]   PATIENT NAME: Cathy Guzman    MR#:  [id number]  DATE OF BIRTH:  01-Mar-1946  DATE OF ADMISSION:  11/03/2017 ADMITTING PHYSICIAN: Cathy Manis, MD  DATE OF DISCHARGE: 11/08/2017  PRIMARY CARE PHYSICIAN: Cathy Baseman, MD    ADMISSION DIAGNOSIS:  Hyponatremia [E87.1]  DISCHARGE DIAGNOSIS:  Acute hyponatremia due to overdiuresis and dehydration---Lasix and metolazone on hold for now Acute on CKD-IV baseline creat 2.9  SECONDARY DIAGNOSIS:   Past Medical History:  Diagnosis Date  . Acute myocardial infarction, subendocardial infarction, initial episode of care (HCC)   . Anginal syndrome (HCC)   . Atopic rhinitis   . Avitaminosis D   . Bacteremia   . Better eye: severe vision impairment; lesser eye: blind   . Bipolar disorder, unspecified (HCC)   . Cannot walk   . CN (constipation)   . Coronary atherosclerosis of native coronary artery   . Encounter for vocational therapy   . Esophageal reflux   . Essential hypertension, malignant   . Gout, unspecified   . Heart failure, diastolic (HCC)   . Hyperlipemia   . Hypomagnesemia   . Hypopotassemia   . Iron deficiency anemia, unspecified   . Muscle weakness (generalized)   . Obstructive chronic bronchitis without exacerbation (HCC)   . Oropharyngeal dysphagia   . Persistent disorder of initiating or maintaining sleep   . Rash and other nonspecific skin eruption   . Scar painful   . Scissor gait   . Shortness of breath   . Spasm of muscle   . Type II diabetes mellitus with renal manifestations (HCC)   . Urinary tract infection, site not specified     HOSPITAL COURSE:  [name] presents with around 1 week of progressive weakness. Patient was admitted about a month ago at [facility] for chest pain and had NSTEMI. She was discharged after treatment to rehab facility. While there she was started on Zaroxolyn in addition to lasix   In the timeframe just before her current symptoms started  1. Acute Hyponatremia  -strong suspicion for lasix/metalozone  induced hyponatremia with strong correlation between timing of medication initiation and onset of symptoms. - We will hold diuretics for now - cautious with fluid administration given her history of heart failure. - came in  With sodium 119--120--119--nephrology consultation and IVF at 40cc/hour--124--123--125 -Patient asymptomatic and appears hydrated -f/u BMP at SNF and Dr Cathy Guzman in 1 week -DIURETICS on HOLD and will be determined as out pt when appropriate to start.  2.Diabetes (HCC) -sliding scale insulin with corresponding glucose checks -Insulin Lantus 10 units  3. COPD (chronic obstructive pulmonary disease) (HCC) -home dose inhalers  4.CAD (coronary artery disease) -continue home meds  5.Chronic diastolic CHF (congestive heart failure) (HCC) -continue home medications, otherwise very cautious fluid administration as above  6.HTN (hypertension) -stable, continue home meds  7.HLD (hyperlipidemia) -Home dose antilipid  8.GERD (gastroesophageal reflux disease) -home dose PPI  9.  History of bipolar disorder.  Patient is on high doses of Seroquel and Zoloft  Appears at baseline. Pt does not walk. D/c to [facility]  Left message with dter Cathy Guzman to notify of pt being discharged.    CONSULTS OBTAINED:  Treatment Team:  Mady Haagensen, MD  DRUG ALLERGIES:   Allergies  Allergen Reactions  . Ace Inhibitors   . Bactrim [Sulfamethoxazole-Trimethoprim]   . Doxycycline Hyclate   . Hydrocodone   . Oxycodone   . Sulfanilamide  DISCHARGE MEDICATIONS:   Allergies as of 11/08/2017      Reactions   Ace Inhibitors    Bactrim [sulfamethoxazole-trimethoprim]    Doxycycline Hyclate    Hydrocodone    Oxycodone    Sulfanilamide       Medication List    STOP taking these medications   furosemide 20 MG tablet Commonly known as:  LASIX    metolazone 2.5 MG tablet Commonly known as:  ZAROXOLYN     TAKE these medications   albuterol (2.5 MG/3ML) 0.083% nebulizer solution Commonly known as:  PROVENTIL Take 2.5 mg by nebulization every 4 (four) hours as needed for wheezing or shortness of breath.   allopurinol 100 MG tablet Commonly known as:  ZYLOPRIM Take 100 mg by mouth daily.   amLODipine 10 MG tablet Commonly known as:  NORVASC Take 10 mg by mouth daily.   ARIPiprazole 20 MG tablet Commonly known as:  ABILIFY Take 20 mg by mouth daily.   aspirin EC 81 MG tablet Take 81 mg by mouth daily.   atorvastatin 80 MG tablet Commonly known as:  LIPITOR Take 80 mg by mouth daily.   clopidogrel 75 MG tablet Commonly known as:  PLAVIX Take 75 mg by mouth daily.   diclofenac sodium 1 % Gel Commonly known as:  VOLTAREN Apply 2 g topically 4 (four) times daily.   dorzolamide 2 % ophthalmic solution Commonly known as:  TRUSOPT Place 1 drop into the right eye 2 (two) times daily.   ferrous sulfate 325 (65 FE) MG EC tablet Take 325 mg by mouth daily with breakfast.   fluticasone 50 MCG/ACT nasal spray Commonly known as:  FLONASE Place 1 spray into both nostrils daily.   Fluticasone-Salmeterol 500-50 MCG/DOSE Aepb Commonly known as:  ADVAIR Inhale 1 puff into the lungs 2 (two) times daily.   insulin glargine 100 UNIT/ML injection Commonly known as:  LANTUS Inject 0.1 mLs (10 Units total) into the skin at bedtime. What changed:  how much to take   isosorbide mononitrate 30 MG 24 hr tablet Commonly known as:  IMDUR Take 90 mg by mouth daily.   lamoTRIgine 100 MG tablet Commonly known as:  LAMICTAL Take 150 mg by mouth daily.   Melatonin 5 MG Tabs Take 10 mg by mouth at bedtime as needed (sleep).   metoprolol tartrate 50 MG tablet Commonly known as:  LOPRESSOR Take 100 mg by mouth 2 (two) times daily.   Omega 3 1000 MG Caps Take 1,000 mg by mouth every evening.   omeprazole 20 MG capsule Commonly  known as:  PRILOSEC Take 40 mg by mouth 2 (two) times daily before a meal.   QUEtiapine 50 MG tablet Commonly known as:  SEROQUEL Take 50 mg by mouth at bedtime.   QUEtiapine 25 MG tablet Commonly known as:  SEROQUEL Take 25 mg by mouth 2 (two) times daily.   senna 8.6 MG Tabs tablet Commonly known as:  SENOKOT Take 2 tablets by mouth at bedtime as needed for mild constipation.   sertraline 100 MG tablet Commonly known as:  ZOLOFT Take 200 mg by mouth at bedtime.   sucralfate 1 g tablet Commonly known as:  CARAFATE Take 1 g by mouth 4 (four) times daily.   tiotropium 18 MCG inhalation capsule Commonly known as:  SPIRIVA Place 18 mcg into inhaler and inhale daily.       If you experience worsening of your admission symptoms, develop shortness of breath, life threatening emergency, suicidal or homicidal  thoughts you must seek medical attention immediately by calling 911 or calling your MD immediately  if symptoms less severe.  You Must read complete instructions/literature along with all the possible adverse reactions/side effects for all the Medicines you take and that have been prescribed to you. Take any new Medicines after you have completely understood and accept all the possible adverse reactions/side effects.   Please note  You were cared for by a hospitalist during your hospital stay. If you have any questions about your discharge medications or the care you received while you were in the hospital after you are discharged, you can call the unit and asked to speak with the hospitalist on call if the hospitalist that took care of you is not available. Once you are discharged, your primary care physician will handle any further medical issues. Please note that NO REFILLS for any discharge medications will be authorized once you are discharged, as it is imperative that you return to your primary care physician (or establish a relationship with a primary care physician if you do  not have one) for your aftercare needs so that they can reassess your need for medications and monitor your lab values. Today   SUBJECTIVE    No new complaints VITAL SIGNS:  Blood pressure (!) 120/58, pulse 62, temperature (!) 97.4 F (36.3 C), temperature source Oral, resp. rate 20, height 5\' 8"  (1.727 m), weight 118.7 kg (261 lb 11 oz), SpO2 99 %.  I/O:    Intake/Output Summary (Last 24 hours) at 11/08/2017 0728 Last data filed at 11/08/2017 1610 Gross per 24 hour  Intake 480 ml  Output 600 ml  Net -120 ml    PHYSICAL EXAMINATION:  GENERAL:  72 y.o.-year-old patient lying in the bed with no acute distress. obsese EYES: Pupils equal, round, reactive to light and accommodation. No scleral icterus. Extraocular muscles intact.  HEENT: Head atraumatic, normocephalic. Oropharynx and nasopharynx clear.  NECK:  Supple, no jugular venous distention. No thyroid enlargement, no tenderness.  LUNGS: Normal breath sounds bilaterally, no wheezing, rales,rhonchi or crepitation. No use of accessory muscles of respiration.  CARDIOVASCULAR: S1, S2 normal. No murmurs, rubs, or gallops.  ABDOMEN: Soft, non-tender, non-distended. Bowel sounds present. No organomegaly or mass. Bruise + from heparin. Mild oozing from left lower quadrant skin+foam pad + EXTREMITIES: chronic 1+ pedal edema and venous stasis nd various bruises over the arms and legs NEUROLOGIC: Cranial nerves II through XII are intact no focal weakness. Sensation intact. Gait not checked. Pt is bed bound PSYCHIATRIC: The patient is alert and oriented x 3.  SKIN: No obvious rash, lesion, or ulcer.   DATA REVIEW:   CBC  Recent Labs  Lab 11/07/17 0816  WBC 6.7  HGB 8.6*  HCT 26.3*  PLT 223    Chemistries  Recent Labs  Lab 11/07/17 0816 11/08/17 0509  NA 123* 125*  K 3.6  --   CL 82*  --   CO2 32  --   GLUCOSE 121*  --   BUN 60*  --   CREATININE 2.74*  --   CALCIUM 8.6*  --   AST 15  --   ALT 9*  --   ALKPHOS 89  --    BILITOT 0.5  --     Microbiology Results   No results found for this or any previous visit (from the past 240 hour(s)).  RADIOLOGY:  US Renal  Result Date: 11/06/2017 CLINICAL DATA:  Acute renal failure. EXAM: RENAL / URINARY TRACT  ULTRASOUND COMPLETE COMPARISON:  None. FINDINGS: Right Kidney: Length: 10.1 cm. 2.8 cm simple cyst is noted in midpole. Increased echogenicity of renal parenchyma is noted. No mass or hydronephrosis visualized. Left Kidney: Length: 10.5 cm. Increased echogenicity of renal parenchyma is noted. No mass or hydronephrosis visualized. Bladder: Appears normal for degree of bladder distention. IMPRESSION: Increased echogenicity of renal parenchyma is noted bilaterally suggesting medical renal disease. No hydronephrosis or renal obstruction is noted. Electronically Signed   By: Lupita Raider, M.D.   On: 11/06/2017 09:20     Management plans discussed with the patient, family and they are in agreement.  CODE STATUS:     Code Status Orders  (From admission, onward)        Start     Ordered   11/04/17 0134  Full code  Continuous     11/04/17 0134    Code Status History    Date Active Date Inactive Code Status Order ID Comments User Context   This patient has a current code status but no historical code status.    Advance Directive Documentation     Most Recent Value  Type of Advance Directive  Healthcare Power of Attorney, Out of facility DNR (pink MOST or yellow form)  Pre-existing out of facility DNR order (yellow form or pink MOST form)  Pink MOST form placed in chart (order not valid for inpatient use)  "MOST" Form in Place?  No data      TOTAL TIME TAKING CARE OF THIS PATIENT: *40* minutes.    Enedina Finner M.D on 11/08/2017 at 7:28 AM  Between 7am to 6pm - Pager - [phone] After 6pm go to [email or web address] - Social research officer, government  [facility]  Office  [phone]  CC: Primary care physician; Cathy Baseman, MD

## 2017-11-08 NOTE — Progress Notes (Signed)
D/C instructions was called to Belinda at [facility]. She stated that she wanted a prescription for Tramadol for the pt so I called Dr. Allena KatzPatel and she sent one. Pt's IV was taken out without incident. EMS was alerted to pt's discoloration on arms and that she has some swelling due to the fluids which was stopped this AM. Her VS were WDL and she was released to their care.

## 2017-11-08 NOTE — Progress Notes (Signed)
MD was called in regard to patient having a puncture to left side of abdomen. Pressure was applied and dressing placed on opening. MD is on his way to assess.

## 2017-11-10 ENCOUNTER — Inpatient Hospital Stay
Admission: EM | Admit: 2017-11-10 | Discharge: 2017-11-14 | DRG: 280 | Disposition: A | Discharge status: Skilled Nursing Facility | Payer: Medicare Other | Attending: Internal Medicine | Admitting: Internal Medicine

## 2017-11-10 ENCOUNTER — Encounter: Payer: Self-pay | Admitting: Emergency Medicine

## 2017-11-10 ENCOUNTER — Emergency Department: Payer: Medicare Other

## 2017-11-10 ENCOUNTER — Other Ambulatory Visit: Payer: Self-pay

## 2017-11-10 DIAGNOSIS — R1312 Dysphagia, oropharyngeal phase: Secondary | ICD-10-CM | POA: Diagnosis present

## 2017-11-10 DIAGNOSIS — H5440 Blindness, one eye, unspecified eye: Secondary | ICD-10-CM | POA: Diagnosis present

## 2017-11-10 DIAGNOSIS — I214 Non-ST elevation (NSTEMI) myocardial infarction: Secondary | ICD-10-CM | POA: Diagnosis present

## 2017-11-10 DIAGNOSIS — R0603 Acute respiratory distress: Secondary | ICD-10-CM

## 2017-11-10 DIAGNOSIS — K219 Gastro-esophageal reflux disease without esophagitis: Secondary | ICD-10-CM | POA: Diagnosis present

## 2017-11-10 DIAGNOSIS — Z885 Allergy status to narcotic agent status: Secondary | ICD-10-CM

## 2017-11-10 DIAGNOSIS — N184 Chronic kidney disease, stage 4 (severe): Secondary | ICD-10-CM | POA: Diagnosis not present

## 2017-11-10 DIAGNOSIS — Z825 Family history of asthma and other chronic lower respiratory diseases: Secondary | ICD-10-CM

## 2017-11-10 DIAGNOSIS — J441 Chronic obstructive pulmonary disease with (acute) exacerbation: Secondary | ICD-10-CM | POA: Diagnosis present

## 2017-11-10 DIAGNOSIS — J9620 Acute and chronic respiratory failure, unspecified whether with hypoxia or hypercapnia: Secondary | ICD-10-CM | POA: Diagnosis present

## 2017-11-10 DIAGNOSIS — E872 Acidosis: Secondary | ICD-10-CM | POA: Diagnosis present

## 2017-11-10 DIAGNOSIS — I2510 Atherosclerotic heart disease of native coronary artery without angina pectoris: Secondary | ICD-10-CM | POA: Diagnosis present

## 2017-11-10 DIAGNOSIS — E785 Hyperlipidemia, unspecified: Secondary | ICD-10-CM | POA: Diagnosis present

## 2017-11-10 DIAGNOSIS — J309 Allergic rhinitis, unspecified: Secondary | ICD-10-CM | POA: Diagnosis present

## 2017-11-10 DIAGNOSIS — Z9049 Acquired absence of other specified parts of digestive tract: Secondary | ICD-10-CM | POA: Diagnosis not present

## 2017-11-10 DIAGNOSIS — I509 Heart failure, unspecified: Secondary | ICD-10-CM | POA: Diagnosis not present

## 2017-11-10 DIAGNOSIS — I5033 Acute on chronic diastolic (congestive) heart failure: Secondary | ICD-10-CM | POA: Diagnosis present

## 2017-11-10 DIAGNOSIS — Z9981 Dependence on supplemental oxygen: Secondary | ICD-10-CM

## 2017-11-10 DIAGNOSIS — R627 Adult failure to thrive: Secondary | ICD-10-CM | POA: Diagnosis present

## 2017-11-10 DIAGNOSIS — Z6839 Body mass index (BMI) 39.0-39.9, adult: Secondary | ICD-10-CM

## 2017-11-10 DIAGNOSIS — J9621 Acute and chronic respiratory failure with hypoxia: Secondary | ICD-10-CM

## 2017-11-10 DIAGNOSIS — I252 Old myocardial infarction: Secondary | ICD-10-CM | POA: Diagnosis not present

## 2017-11-10 DIAGNOSIS — Z66 Do not resuscitate: Secondary | ICD-10-CM

## 2017-11-10 DIAGNOSIS — E1122 Type 2 diabetes mellitus with diabetic chronic kidney disease: Secondary | ICD-10-CM | POA: Diagnosis present

## 2017-11-10 DIAGNOSIS — I445 Left posterior fascicular block: Secondary | ICD-10-CM | POA: Diagnosis present

## 2017-11-10 DIAGNOSIS — Z9884 Bariatric surgery status: Secondary | ICD-10-CM | POA: Diagnosis not present

## 2017-11-10 DIAGNOSIS — E876 Hypokalemia: Secondary | ICD-10-CM | POA: Diagnosis present

## 2017-11-10 DIAGNOSIS — D509 Iron deficiency anemia, unspecified: Secondary | ICD-10-CM | POA: Diagnosis present

## 2017-11-10 DIAGNOSIS — I130 Hypertensive heart and chronic kidney disease with heart failure and stage 1 through stage 4 chronic kidney disease, or unspecified chronic kidney disease: Secondary | ICD-10-CM | POA: Diagnosis present

## 2017-11-10 DIAGNOSIS — M109 Gout, unspecified: Secondary | ICD-10-CM | POA: Diagnosis present

## 2017-11-10 DIAGNOSIS — F319 Bipolar disorder, unspecified: Secondary | ICD-10-CM | POA: Diagnosis present

## 2017-11-10 DIAGNOSIS — Z515 Encounter for palliative care: Secondary | ICD-10-CM

## 2017-11-10 DIAGNOSIS — I5021 Acute systolic (congestive) heart failure: Secondary | ICD-10-CM

## 2017-11-10 DIAGNOSIS — Z7982 Long term (current) use of aspirin: Secondary | ICD-10-CM

## 2017-11-10 DIAGNOSIS — Z83511 Family history of glaucoma: Secondary | ICD-10-CM

## 2017-11-10 DIAGNOSIS — Z794 Long term (current) use of insulin: Secondary | ICD-10-CM

## 2017-11-10 DIAGNOSIS — Z8249 Family history of ischemic heart disease and other diseases of the circulatory system: Secondary | ICD-10-CM

## 2017-11-10 DIAGNOSIS — I4510 Unspecified right bundle-branch block: Secondary | ICD-10-CM | POA: Diagnosis present

## 2017-11-10 DIAGNOSIS — Z888 Allergy status to other drugs, medicaments and biological substances status: Secondary | ICD-10-CM

## 2017-11-10 DIAGNOSIS — I5023 Acute on chronic systolic (congestive) heart failure: Secondary | ICD-10-CM

## 2017-11-10 DIAGNOSIS — R040 Epistaxis: Secondary | ICD-10-CM | POA: Diagnosis not present

## 2017-11-10 DIAGNOSIS — Z90710 Acquired absence of both cervix and uterus: Secondary | ICD-10-CM

## 2017-11-10 DIAGNOSIS — Z881 Allergy status to other antibiotic agents status: Secondary | ICD-10-CM | POA: Diagnosis not present

## 2017-11-10 DIAGNOSIS — Z79899 Other long term (current) drug therapy: Secondary | ICD-10-CM

## 2017-11-10 DIAGNOSIS — G2581 Restless legs syndrome: Secondary | ICD-10-CM | POA: Diagnosis present

## 2017-11-10 DIAGNOSIS — K5900 Constipation, unspecified: Secondary | ICD-10-CM | POA: Diagnosis present

## 2017-11-10 DIAGNOSIS — Z7951 Long term (current) use of inhaled steroids: Secondary | ICD-10-CM

## 2017-11-10 DIAGNOSIS — Z820 Family history of epilepsy and other diseases of the nervous system: Secondary | ICD-10-CM

## 2017-11-10 DIAGNOSIS — D631 Anemia in chronic kidney disease: Secondary | ICD-10-CM | POA: Diagnosis present

## 2017-11-10 DIAGNOSIS — Z882 Allergy status to sulfonamides status: Secondary | ICD-10-CM

## 2017-11-10 DIAGNOSIS — Z7902 Long term (current) use of antithrombotics/antiplatelets: Secondary | ICD-10-CM

## 2017-11-10 HISTORY — DX: Heart failure, unspecified: I50.9

## 2017-11-10 LAB — BLOOD GAS, ARTERIAL
ACID-BASE EXCESS: 6.9 mmol/L — AB (ref 0.0–2.0)
Bicarbonate: 33.9 mmol/L — ABNORMAL HIGH (ref 20.0–28.0)
Expiratory PAP: 6
FIO2: 0.35
Inspiratory PAP: 12
O2 Saturation: 90.4 %
PH ART: 7.36 (ref 7.350–7.450)
Patient temperature: 37
pCO2 arterial: 60 mmHg — ABNORMAL HIGH (ref 32.0–48.0)
pO2, Arterial: 62 mmHg — ABNORMAL LOW (ref 83.0–108.0)

## 2017-11-10 LAB — CBC WITH DIFFERENTIAL/PLATELET
BASOS PCT: 0 %
Basophils Absolute: 0 10*3/uL (ref 0–0.1)
Eosinophils Absolute: 0.1 10*3/uL (ref 0–0.7)
Eosinophils Relative: 1 %
HEMATOCRIT: 28.5 % — AB (ref 35.0–47.0)
HEMOGLOBIN: 9.2 g/dL — AB (ref 12.0–16.0)
LYMPHS ABS: 0.6 10*3/uL — AB (ref 1.0–3.6)
LYMPHS PCT: 5 %
MCH: 29.3 pg (ref 26.0–34.0)
MCHC: 32.4 g/dL (ref 32.0–36.0)
MCV: 90.4 fL (ref 80.0–100.0)
MONOS PCT: 6 %
Monocytes Absolute: 0.7 10*3/uL (ref 0.2–0.9)
NEUTROS ABS: 10.4 10*3/uL — AB (ref 1.4–6.5)
NEUTROS PCT: 88 %
Platelets: 263 10*3/uL (ref 150–440)
RBC: 3.15 MIL/uL — ABNORMAL LOW (ref 3.80–5.20)
RDW: 15.3 % — ABNORMAL HIGH (ref 11.5–14.5)
WBC: 11.8 10*3/uL — ABNORMAL HIGH (ref 3.6–11.0)

## 2017-11-10 LAB — GLUCOSE, CAPILLARY
GLUCOSE-CAPILLARY: 159 mg/dL — AB (ref 65–99)
Glucose-Capillary: 204 mg/dL — ABNORMAL HIGH (ref 65–99)
Glucose-Capillary: 218 mg/dL — ABNORMAL HIGH (ref 65–99)

## 2017-11-10 LAB — BASIC METABOLIC PANEL
Anion gap: 12 (ref 5–15)
BUN: 45 mg/dL — ABNORMAL HIGH (ref 6–20)
CHLORIDE: 89 mmol/L — AB (ref 101–111)
CO2: 32 mmol/L (ref 22–32)
CREATININE: 2.18 mg/dL — AB (ref 0.44–1.00)
Calcium: 9.9 mg/dL (ref 8.9–10.3)
GFR calc non Af Amer: 21 mL/min — ABNORMAL LOW (ref >60)
GFR, EST AFRICAN AMERICAN: 25 mL/min — AB (ref >60)
Glucose, Bld: 167 mg/dL — ABNORMAL HIGH (ref 65–99)
Potassium: 4.7 mmol/L (ref 3.5–5.1)
Sodium: 133 mmol/L — ABNORMAL LOW (ref 135–145)

## 2017-11-10 LAB — PROCALCITONIN: PROCALCITONIN: 0.13 ng/mL

## 2017-11-10 LAB — TROPONIN I: Troponin I: 0.05 ng/mL (ref <0.03)

## 2017-11-10 LAB — BRAIN NATRIURETIC PEPTIDE
B NATRIURETIC PEPTIDE 5: 1079 pg/mL — AB (ref 0.0–100.0)
B Natriuretic Peptide: 1026 pg/mL — ABNORMAL HIGH (ref 0.0–100.0)

## 2017-11-10 LAB — MRSA PCR SCREENING: MRSA BY PCR: NEGATIVE

## 2017-11-10 MED ORDER — ALBUTEROL SULFATE (2.5 MG/3ML) 0.083% IN NEBU
7.5000 mg | INHALATION_SOLUTION | Freq: Once | RESPIRATORY_TRACT | Status: AC
  Administered 2017-11-10: 2.5 mg via RESPIRATORY_TRACT
  Filled 2017-11-10: qty 9

## 2017-11-10 MED ORDER — SUCRALFATE 1 G PO TABS
1.0000 g | ORAL_TABLET | Freq: Three times a day (TID) | ORAL | Status: DC
  Administered 2017-11-10 – 2017-11-14 (×14): 1 g via ORAL
  Filled 2017-11-10 (×17): qty 1

## 2017-11-10 MED ORDER — ATORVASTATIN CALCIUM 20 MG PO TABS
80.0000 mg | ORAL_TABLET | Freq: Every day | ORAL | Status: DC
  Administered 2017-11-11 – 2017-11-14 (×4): 80 mg via ORAL
  Filled 2017-11-10 (×4): qty 4

## 2017-11-10 MED ORDER — POLYETHYLENE GLYCOL 3350 17 G PO PACK
17.0000 g | PACK | Freq: Every day | ORAL | Status: DC
  Administered 2017-11-10 – 2017-11-14 (×5): 17 g via ORAL
  Filled 2017-11-10 (×5): qty 1

## 2017-11-10 MED ORDER — LAMOTRIGINE 100 MG PO TABS
150.0000 mg | ORAL_TABLET | Freq: Every day | ORAL | Status: DC
  Administered 2017-11-11 – 2017-11-14 (×4): 150 mg via ORAL
  Filled 2017-11-10 (×2): qty 6
  Filled 2017-11-10 (×2): qty 2

## 2017-11-10 MED ORDER — MELATONIN 5 MG PO TABS
10.0000 mg | ORAL_TABLET | Freq: Every evening | ORAL | Status: DC | PRN
  Administered 2017-11-11 – 2017-11-12 (×2): 10 mg via ORAL
  Filled 2017-11-10 (×3): qty 2

## 2017-11-10 MED ORDER — INSULIN GLARGINE 100 UNIT/ML ~~LOC~~ SOLN
10.0000 [IU] | Freq: Every day | SUBCUTANEOUS | Status: DC
  Administered 2017-11-10 – 2017-11-11 (×2): 10 [IU] via SUBCUTANEOUS
  Filled 2017-11-10 (×3): qty 0.1

## 2017-11-10 MED ORDER — MAGNESIUM SULFATE 2 GM/50ML IV SOLN
2.0000 g | Freq: Once | INTRAVENOUS | Status: AC
  Administered 2017-11-10: 2 g via INTRAVENOUS
  Filled 2017-11-10: qty 50

## 2017-11-10 MED ORDER — METHYLPREDNISOLONE SODIUM SUCC 125 MG IJ SOLR
60.0000 mg | Freq: Four times a day (QID) | INTRAMUSCULAR | Status: DC
Start: 2017-11-10 — End: 2017-11-11
  Administered 2017-11-10 – 2017-11-11 (×4): 60 mg via INTRAVENOUS
  Filled 2017-11-10 (×4): qty 2

## 2017-11-10 MED ORDER — ALUM & MAG HYDROXIDE-SIMETH 200-200-20 MG/5ML PO SUSP
15.0000 mL | Freq: Four times a day (QID) | ORAL | Status: DC | PRN
  Administered 2017-11-11 (×2): 15 mL via ORAL
  Filled 2017-11-10 (×3): qty 30

## 2017-11-10 MED ORDER — ASPIRIN EC 81 MG PO TBEC
81.0000 mg | DELAYED_RELEASE_TABLET | Freq: Every day | ORAL | Status: DC
  Administered 2017-11-12 – 2017-11-14 (×3): 81 mg via ORAL
  Filled 2017-11-10 (×4): qty 1

## 2017-11-10 MED ORDER — ROPINIROLE HCL 0.5 MG PO TABS
0.5000 mg | ORAL_TABLET | Freq: Every day | ORAL | Status: AC
  Administered 2017-11-10: 0.5 mg via ORAL
  Filled 2017-11-10: qty 1

## 2017-11-10 MED ORDER — FERROUS SULFATE 325 (65 FE) MG PO TABS
325.0000 mg | ORAL_TABLET | Freq: Every day | ORAL | Status: DC
  Administered 2017-11-11 – 2017-11-14 (×4): 325 mg via ORAL
  Filled 2017-11-10 (×4): qty 1

## 2017-11-10 MED ORDER — DORZOLAMIDE HCL 2 % OP SOLN
1.0000 [drp] | Freq: Two times a day (BID) | OPHTHALMIC | Status: DC
  Administered 2017-11-10 – 2017-11-14 (×9): 1 [drp] via OPHTHALMIC
  Filled 2017-11-10 (×3): qty 10

## 2017-11-10 MED ORDER — QUETIAPINE FUMARATE 25 MG PO TABS
25.0000 mg | ORAL_TABLET | Freq: Two times a day (BID) | ORAL | Status: DC
  Administered 2017-11-10 – 2017-11-14 (×8): 25 mg via ORAL
  Filled 2017-11-10 (×8): qty 1

## 2017-11-10 MED ORDER — IPRATROPIUM-ALBUTEROL 0.5-2.5 (3) MG/3ML IN SOLN
3.0000 mL | RESPIRATORY_TRACT | Status: DC
  Administered 2017-11-10 – 2017-11-14 (×24): 3 mL via RESPIRATORY_TRACT
  Filled 2017-11-10 (×25): qty 3

## 2017-11-10 MED ORDER — TIOTROPIUM BROMIDE MONOHYDRATE 18 MCG IN CAPS
18.0000 ug | ORAL_CAPSULE | Freq: Every day | RESPIRATORY_TRACT | Status: DC
  Administered 2017-11-10 – 2017-11-14 (×5): 18 ug via RESPIRATORY_TRACT
  Filled 2017-11-10: qty 5

## 2017-11-10 MED ORDER — HEPARIN SODIUM (PORCINE) 5000 UNIT/ML IJ SOLN
5000.0000 [IU] | Freq: Three times a day (TID) | INTRAMUSCULAR | Status: DC
  Administered 2017-11-10 – 2017-11-11 (×4): 5000 [IU] via SUBCUTANEOUS
  Filled 2017-11-10 (×4): qty 1

## 2017-11-10 MED ORDER — FLUTICASONE PROPIONATE 50 MCG/ACT NA SUSP
1.0000 | Freq: Every day | NASAL | Status: DC
  Administered 2017-11-13: 1 via NASAL
  Filled 2017-11-10: qty 16

## 2017-11-10 MED ORDER — ACETAZOLAMIDE SODIUM 500 MG IJ SOLR
250.0000 mg | Freq: Once | INTRAMUSCULAR | Status: AC
  Administered 2017-11-10: 250 mg via INTRAVENOUS
  Filled 2017-11-10: qty 500

## 2017-11-10 MED ORDER — INSULIN ASPART 100 UNIT/ML ~~LOC~~ SOLN
0.0000 [IU] | Freq: Three times a day (TID) | SUBCUTANEOUS | Status: DC
  Administered 2017-11-10 – 2017-11-11 (×3): 3 [IU] via SUBCUTANEOUS
  Administered 2017-11-11: 2 [IU] via SUBCUTANEOUS
  Administered 2017-11-11: 100 [IU] via SUBCUTANEOUS
  Administered 2017-11-12: 3 [IU] via SUBCUTANEOUS
  Administered 2017-11-12 – 2017-11-13 (×3): 2 [IU] via SUBCUTANEOUS
  Administered 2017-11-13: 1 [IU] via SUBCUTANEOUS
  Administered 2017-11-14: 2 [IU] via SUBCUTANEOUS
  Filled 2017-11-10 (×11): qty 1

## 2017-11-10 MED ORDER — PANTOPRAZOLE SODIUM 40 MG PO TBEC
40.0000 mg | DELAYED_RELEASE_TABLET | Freq: Two times a day (BID) | ORAL | Status: DC
  Administered 2017-11-10 – 2017-11-14 (×8): 40 mg via ORAL
  Filled 2017-11-10 (×8): qty 1

## 2017-11-10 MED ORDER — DICLOFENAC SODIUM 1 % TD GEL
2.0000 g | Freq: Four times a day (QID) | TRANSDERMAL | Status: DC
  Administered 2017-11-10 – 2017-11-14 (×11): 2 g via TOPICAL
  Filled 2017-11-10: qty 100

## 2017-11-10 MED ORDER — ARIPIPRAZOLE 10 MG PO TABS
20.0000 mg | ORAL_TABLET | Freq: Every day | ORAL | Status: DC
  Administered 2017-11-11 – 2017-11-14 (×4): 20 mg via ORAL
  Filled 2017-11-10 (×4): qty 2

## 2017-11-10 MED ORDER — FUROSEMIDE 20 MG PO TABS
40.0000 mg | ORAL_TABLET | Freq: Two times a day (BID) | ORAL | Status: DC
  Administered 2017-11-11 – 2017-11-12 (×3): 40 mg via ORAL
  Filled 2017-11-10 (×3): qty 2

## 2017-11-10 MED ORDER — CLOPIDOGREL BISULFATE 75 MG PO TABS
75.0000 mg | ORAL_TABLET | Freq: Every day | ORAL | Status: DC
  Administered 2017-11-11 – 2017-11-14 (×4): 75 mg via ORAL
  Filled 2017-11-10 (×4): qty 1

## 2017-11-10 MED ORDER — ORAL CARE MOUTH RINSE
15.0000 mL | Freq: Two times a day (BID) | OROMUCOSAL | Status: DC
  Administered 2017-11-10 – 2017-11-13 (×3): 15 mL via OROMUCOSAL

## 2017-11-10 MED ORDER — SERTRALINE HCL 50 MG PO TABS
200.0000 mg | ORAL_TABLET | Freq: Every day | ORAL | Status: DC
  Administered 2017-11-10 – 2017-11-13 (×4): 200 mg via ORAL
  Filled 2017-11-10 (×4): qty 4

## 2017-11-10 MED ORDER — OMEGA-3-ACID ETHYL ESTERS 1 G PO CAPS
1000.0000 mg | ORAL_CAPSULE | Freq: Every evening | ORAL | Status: DC
  Administered 2017-11-10 – 2017-11-13 (×3): 1000 mg via ORAL
  Filled 2017-11-10 (×3): qty 1

## 2017-11-10 MED ORDER — METHYLPREDNISOLONE SODIUM SUCC 125 MG IJ SOLR
125.0000 mg | Freq: Once | INTRAMUSCULAR | Status: AC
  Administered 2017-11-10: 125 mg via INTRAVENOUS
  Filled 2017-11-10: qty 2

## 2017-11-10 MED ORDER — ALLOPURINOL 100 MG PO TABS
100.0000 mg | ORAL_TABLET | Freq: Every day | ORAL | Status: DC
  Administered 2017-11-11 – 2017-11-14 (×4): 100 mg via ORAL
  Filled 2017-11-10 (×4): qty 1

## 2017-11-10 MED ORDER — DOCUSATE SODIUM 100 MG PO CAPS
100.0000 mg | ORAL_CAPSULE | Freq: Two times a day (BID) | ORAL | Status: DC | PRN
Start: 2017-11-10 — End: 2017-11-14

## 2017-11-10 MED ORDER — CHLORHEXIDINE GLUCONATE 0.12 % MT SOLN
15.0000 mL | Freq: Two times a day (BID) | OROMUCOSAL | Status: DC
  Administered 2017-11-10 – 2017-11-14 (×7): 15 mL via OROMUCOSAL
  Filled 2017-11-10 (×9): qty 15

## 2017-11-10 MED ORDER — FUROSEMIDE 10 MG/ML IJ SOLN
20.0000 mg | Freq: Every day | INTRAMUSCULAR | Status: DC
  Administered 2017-11-10: 20 mg via INTRAVENOUS
  Filled 2017-11-10: qty 2

## 2017-11-10 MED ORDER — DIPHENHYDRAMINE HCL 50 MG/ML IJ SOLN: 12.5000 mg | Freq: Once | INTRAMUSCULAR | Status: DC | PRN

## 2017-11-10 MED ORDER — SENNA 8.6 MG PO TABS: 2.0000 | ORAL_TABLET | Freq: Every evening | ORAL | Status: DC | PRN

## 2017-11-10 MED ORDER — BUDESONIDE 0.25 MG/2ML IN SUSP
0.2500 mg | Freq: Two times a day (BID) | RESPIRATORY_TRACT | Status: DC
  Administered 2017-11-10 – 2017-11-14 (×9): 0.25 mg via RESPIRATORY_TRACT
  Filled 2017-11-10 (×9): qty 2

## 2017-11-10 NOTE — Progress Notes (Signed)
During assessment, Pt stated she had pain in both legs.  Spoke with Seward GraterMaggie, NP about same.  New orders received.

## 2017-11-10 NOTE — Consult Note (Signed)
PULMONARY / CRITICAL CARE MEDICINE   Name: Cathy Guzman MRN: [id number] DOB: 06/15/1946    ADMISSION DATE:  11/10/2017 CONSULTATION DATE:  11/10/2017 REFERRING MD:  Dr. Elisabeth Pigeon  CHIEF COMPLAINT:  Shortness of breath  HISTORY OF PRESENT ILLNESS:   28 F with PMH as below significant for COPD, CHF, CKD IV, presented to the ED this morning with 2 days of acutely worsening SOB. Pt was [facility] after a 5-day admission for weakness and hyponatremia. The patient's hyponatremia was attributed to diuretic usage at that time (she was on both lasix and metolazone). She was subsequently discharged home without diuretics.  Pt denies chest pain, but endorses cough productive of "red" sputum. No fevers chills or sweats. On exam, she is a&ox4. WOB is normal on NIV. BS decreased with crackles anteriorly. 1+ LE edema.   PAST MEDICAL HISTORY :  She  has a past medical history of Acute myocardial infarction, subendocardial infarction, initial episode of care (HCC), Anginal syndrome (HCC), Atopic rhinitis, Avitaminosis D, Bacteremia, Better eye: severe vision impairment; lesser eye: blind, Bipolar disorder, unspecified (HCC), Cannot walk, CHF (congestive heart failure) (HCC), CN (constipation), Coronary atherosclerosis of native coronary artery, Encounter for vocational therapy, Esophageal reflux, Essential hypertension, malignant, Gout, unspecified, Heart failure, diastolic (HCC), Hyperlipemia, Hypomagnesemia, Hypopotassemia, Iron deficiency anemia, unspecified, Muscle weakness (generalized), Obstructive chronic bronchitis without exacerbation (HCC), Oropharyngeal dysphagia, Persistent disorder of initiating or maintaining sleep, Rash and other nonspecific skin eruption, Scar painful, Scissor gait, Shortness of breath, Spasm of muscle, Type II diabetes mellitus with renal manifestations (HCC), and Urinary tract infection, site not specified.  PAST SURGICAL HISTORY: She  has a past surgical history  that includes Abdominal surgery; Gastric bypass; Cholecystectomy; and Abdominal hysterectomy.  Allergies  Allergen Reactions  . Ace Inhibitors     unknown  . Bactrim [Sulfamethoxazole-Trimethoprim]     unknown  . Doxycycline Hyclate     unknown  . Hydrocodone     unknown  . Oxycodone     unknown  . Sulfanilamide     unknown    No current facility-administered medications on file prior to encounter.    Current Outpatient Medications on File Prior to Encounter  Medication Sig  . albuterol (PROVENTIL HFA;VENTOLIN HFA) 108 (90 Base) MCG/ACT inhaler Inhale 2 puffs into the lungs every 3 (three) hours as needed for wheezing or shortness of breath.  . allopurinol (ZYLOPRIM) 100 MG tablet Take 100 mg by mouth daily.  Marland Kitchen alum & mag hydroxide-simeth (MAALOX/MYLANTA) 200-200-20 MG/5ML suspension Take by mouth every 4 (four) hours as needed for indigestion or heartburn.  Marland Kitchen amLODipine (NORVASC) 10 MG tablet Take 10 mg by mouth daily.  . ARIPiprazole (ABILIFY) 20 MG tablet Take 20 mg by mouth daily.  Marland Kitchen aspirin EC 81 MG tablet Take 81 mg by mouth daily.  Marland Kitchen atorvastatin (LIPITOR) 80 MG tablet Take 80 mg by mouth daily.  . clopidogrel (PLAVIX) 75 MG tablet Take 75 mg by mouth daily.  . diclofenac sodium (VOLTAREN) 1 % GEL Apply 2 g topically 4 (four) times daily.  . dorzolamide (TRUSOPT) 2 % ophthalmic solution Place 1 drop into the right eye 2 (two) times daily.  . ferrous sulfate 325 (65 FE) MG EC tablet Take 325 mg by mouth daily with breakfast.  . fluticasone (FLONASE) 50 MCG/ACT nasal spray Place 1 spray into both nostrils daily.  . Fluticasone-Salmeterol (ADVAIR) 500-50 MCG/DOSE AEPB Inhale 1 puff into the lungs 2 (two) times daily.  . insulin glargine (LANTUS) 100  UNIT/ML injection Inject 0.1 mLs (10 Units total) into the skin at bedtime.  . isosorbide mononitrate (IMDUR) 30 MG 24 hr tablet Take 90 mg by mouth daily.  Marland Kitchen lamoTRIgine (LAMICTAL) 100 MG tablet Take 150 mg by mouth daily.  .  Melatonin 10 MG TABS Take 10 mg by mouth at bedtime as needed (for sleeping).  . metoprolol tartrate (LOPRESSOR) 50 MG tablet Take 100 mg by mouth 2 (two) times daily.  . Omega 3 1000 MG CAPS Take 1,000 mg by mouth every evening.  Marland Kitchen omega-3 acid ethyl esters (LOVAZA) 1 g capsule Take 1 g by mouth daily.  Marland Kitchen omeprazole (PRILOSEC) 20 MG capsule Take 40 mg by mouth 2 (two) times daily before a meal.  . polyethylene glycol (MIRALAX / GLYCOLAX) packet Take 17 g by mouth daily.  . QUEtiapine (SEROQUEL) 25 MG tablet Take 25 mg by mouth 2 (two) times daily.  . QUEtiapine (SEROQUEL) 50 MG tablet Take 50 mg by mouth at bedtime.  . senna (SENOKOT) 8.6 MG TABS tablet Take 2 tablets by mouth at bedtime as needed for mild constipation.  . sertraline (ZOLOFT) 100 MG tablet Take 200 mg by mouth at bedtime.  . sucralfate (CARAFATE) 1 g tablet Take 1 g by mouth 4 (four) times daily.  Marland Kitchen tiotropium (SPIRIVA) 18 MCG inhalation capsule Place 18 mcg into inhaler and inhale daily.    FAMILY HISTORY:  Her indicated that the status of her mother is unknown. She indicated that the status of her father is unknown. She indicated that the status of her brother is unknown.   SOCIAL HISTORY: She  reports that  has never smoked. she has never used smokeless tobacco. She reports that she does not drink alcohol or use drugs.  REVIEW OF SYSTEMS:   General no fever, chills sweats HEENT no rhinorrhea Pulm positive for cough, hemoptysis, dyspnea, negative for PND CV negative for chest pain, palpitations GI negative for n/v/d GU negative for dysuria MSK negative for myalgias Neuro negative for headache  SUBJECTIVE:  Pt is tired, but breathing better with NIV.   VITAL SIGNS: BP 119/85 (BP Location: Right Arm)   Pulse 89   Temp 98.3 F (36.8 C) (Axillary)   Resp (!) 23   Ht 5\' 8"  (1.727 m)   Wt 119 kg (262 lb 5.6 oz)   SpO2 97%   BMI 39.89 kg/m   HEMODYNAMICS:    VENTILATOR SETTINGS: FiO2 (%):  [35 %] 35  %  INTAKE / OUTPUT: No intake/output data recorded.  PHYSICAL EXAMINATION: General:  Ill-appearing, non-distressed Neuro:  Grossly non-focal, a&ox4 HEENT:  NCAT Cardiovascular:  S1S2 RRR, no m/g/r, 1+ peripheral pulses Lungs:  Decreased BS with bilateral crackles anteriorly Abdomen:  Obese, soft non-tender Musculoskeletal:  No gross deformity Skin:  Warm, dry, acyanotic  LABS:  BMET Recent Labs  Lab 11/04/17 0703  11/07/17 0816 11/08/17 0509 11/10/17 0904  NA 120*   < > 123* 125* 133*  K 3.4*  --  3.6  --  4.7  CL 80*  --  82*  --  89*  CO2 31  --  32  --  32  BUN 65*  --  60*  --  45*  CREATININE 3.28*  --  2.74*  --  2.18*  GLUCOSE 114*  --  121*  --  167*   < > = values in this interval not displayed.    Electrolytes Recent Labs  Lab 11/04/17 0703 11/07/17 0816 11/10/17 0904  CALCIUM  8.4* 8.6* 9.9    CBC Recent Labs  Lab 11/04/17 0703 11/07/17 0816 11/10/17 0904  WBC 6.3 6.7 11.8*  HGB 9.0* 8.6* 9.2*  HCT 26.7* 26.3* 28.5*  PLT 186 223 263    Coag's No results for input(s): APTT, INR in the last 168 hours.  Sepsis Markers Recent Labs  Lab 11/10/17 0904  PROCALCITON 0.13    ABG Recent Labs  Lab 11/10/17 0948  PHART 7.36  PCO2ART 60*  PO2ART 62*    Liver Enzymes Recent Labs  Lab 11/03/17 2050 11/07/17 0816  AST 13* 15  ALT 11* 9*  ALKPHOS 89 89  BILITOT 0.5 0.5  ALBUMIN 3.1* 2.9*    Cardiac Enzymes Recent Labs  Lab 11/10/17 0904  TROPONINI 0.05*    Glucose Recent Labs  Lab 11/07/17 0753 11/07/17 1152 11/07/17 1718 11/07/17 2116 11/08/17 0729 11/10/17 1201  GLUCAP 113* 145* 144* 160* 114* 159*    Imaging Dg Chest 1 View  Result Date: 11/10/2017 CLINICAL DATA:  Shortness of Breath EXAM: CHEST 1 VIEW COMPARISON:  None. FINDINGS: Cardiac shadow is mildly enlarged. Prominent central vascularity is noted with some mild interstitial edema. Bibasilar atelectatic changes are seen. No sizable effusion is noted. No acute  bony abnormality is seen. IMPRESSION: Prominent central vascularity with mild interstitial edema. Electronically Signed   By: Alcide CleverMark  Lukens M.D.   On: 11/10/2017 09:23    STUDIES:  2/1 CXR with bilateral pleural effusions and perihilar consolidation c/w pulmonary edema  SIGNIFICANT EVENTS: 1/25--[facility] with weakness and hypontremia attributed to diuretic use 1/30--discharged to SNF without diuretics 2/1--pt presents to ED with progressive SOB since discharge, hypoxic requiring NIV. CXR c/w pulmonary edema. PCT negative. Admitted to ICU on NIV.   LINES/TUBES:   DISCUSSION: Exam, labs, and imaging most consistent with acute CHF exacerbation. Pt has not had diuretics in the past two days since discharge. ? Component of COPD, no evidence for PNA.    ASSESSMENT / PLAN:  PULMONARY A: Acute hypoxic respiratory failure 2/2 pulmonary edema H/O COPD P:   Will diuresis with diamox initially After that, restart lasix Continue bronchodilators Discontinue systemic steroids  CARDIOVASCULAR A:  Acute exacerbation of HFpEF H/O Dyslipidemia H/O CAD P:  Diuresis as above Continue atorvastatin Continue ASA Will try trial off of NIV BiPAP PRN Supplemental O2 PRN, spO2 goal 88-92%  RENAL A:   Chronic Kidney Disease, stage 4 baseline Cr 2.95 Chronic Respiratory Acidosis P:   Follow BMP carefully with diuresis  GASTROINTESTINAL A:   H/O GERD P:   Continue pantoprazole   HEMATOLOGIC A:   Mild anemia in the setting of CKD P:  Follow CBC Benjamin heparin for DVT prophylaxis   INFECTIOUS A:   No issues Negative PCT P:    ENDOCRINE A: H/O T2DM    P:   SSI Lantus at bedtime  NEUROLOGIC A:   No issues P:    PSYCHIATRIC A: H/O Bipolar d/o  P: Continue lamictal Continue seroquel Continue zoloft Continue abilify  FAMILY  - Updates: daughter at bedside, updated in detail   Elinor DodgeDoug Margaret Cockerill, PA-S  11/10/2017, 1:29 PM

## 2017-11-10 NOTE — ED Provider Notes (Signed)
[facility] Emergency Department Provider Note  ____________________________________________   First MD Initiated Contact with Patient 11/10/17 0901     (approximate)  I have reviewed the triage vital signs and the nursing notes.   HISTORY  Chief Complaint Shortness of Breath   HPI Cathy Guzman is a 72 y.o. female with worsening shortness of breath that started over the course of the past 24 hours who is presenting to the emergency department for respiratory distress.  EMS reports that the patient was saturating in the low to mid 80s on her baseline 2 L of nasal cannula oxygen.  She was then increased to 4 L and then placed on a neb treatment.  She has been given 2 DuoNeb's prior to arrival.  The patient reports a productive cough over the past 24 hours.  Denies any pain.  Denies any increased swelling to her bilateral lower extremities.  Denies any fever.  EMS reported rhonchi as well as wheezing throughout all fields.   Past Medical History:  Diagnosis Date  . Acute myocardial infarction, subendocardial infarction, initial episode of care (HCC)   . Anginal syndrome (HCC)   . Atopic rhinitis   . Avitaminosis D   . Bacteremia   . Better eye: severe vision impairment; lesser eye: blind   . Bipolar disorder, unspecified (HCC)   . Cannot walk   . CN (constipation)   . Coronary atherosclerosis of native coronary artery   . Encounter for vocational therapy   . Esophageal reflux   . Essential hypertension, malignant   . Gout, unspecified   . Heart failure, diastolic (HCC)   . Hyperlipemia   . Hypomagnesemia   . Hypopotassemia   . Iron deficiency anemia, unspecified   . Muscle weakness (generalized)   . Obstructive chronic bronchitis without exacerbation (HCC)   . Oropharyngeal dysphagia   . Persistent disorder of initiating or maintaining sleep   . Rash and other nonspecific skin eruption   . Scar painful   . Scissor gait   . Shortness of breath     . Spasm of muscle   . Type II diabetes mellitus with renal manifestations (HCC)   . Urinary tract infection, site not specified     Patient Active Problem List   Diagnosis Date Noted  . Hyponatremia 11/03/2017  . Diabetes (HCC) 11/03/2017  . COPD (chronic obstructive pulmonary disease) (HCC) 11/03/2017  . CAD (coronary artery disease) 11/03/2017  . HLD (hyperlipidemia) 11/03/2017  . Chronic diastolic CHF (congestive heart failure) (HCC) 11/03/2017  . GERD (gastroesophageal reflux disease) 11/03/2017  . HTN (hypertension) 11/03/2017    Past Surgical History:  Procedure Laterality Date  . ABDOMINAL HYSTERECTOMY    . ABDOMINAL SURGERY    . CHOLECYSTECTOMY    . GASTRIC BYPASS      Prior to Admission medications   Medication Sig Start Date End Date Taking? Authorizing Provider  albuterol (PROVENTIL) (2.5 MG/3ML) 0.083% nebulizer solution Take 2.5 mg by nebulization every 4 (four) hours as needed for wheezing or shortness of breath.    [provider]  allopurinol (ZYLOPRIM) 100 MG tablet Take 100 mg by mouth daily.    [provider]  amLODipine (NORVASC) 10 MG tablet Take 10 mg by mouth daily.    [provider]  ARIPiprazole (ABILIFY) 20 MG tablet Take 20 mg by mouth daily.    [provider]  aspirin EC 81 MG tablet Take 81 mg by mouth daily.    [provider]  atorvastatin (LIPITOR) 80 MG tablet Take 80 mg by mouth daily.    [provider]  clopidogrel (PLAVIX) 75 MG tablet Take 75 mg by mouth daily.    [provider]  diclofenac sodium (VOLTAREN) 1 % GEL Apply 2 g topically 4 (four) times daily.    [provider]  dorzolamide (TRUSOPT) 2 % ophthalmic solution Place 1 drop into the right eye 2 (two) times daily.    [provider]  ferrous sulfate 325 (65 FE) MG EC tablet Take 325 mg by mouth daily with breakfast.    [provider]  fluticasone (FLONASE) 50 MCG/ACT nasal spray Place 1  spray into both nostrils daily.    [provider]  Fluticasone-Salmeterol (ADVAIR) 500-50 MCG/DOSE AEPB Inhale 1 puff into the lungs 2 (two) times daily.    [provider]  insulin glargine (LANTUS) 100 UNIT/ML injection Inject 0.1 mLs (10 Units total) into the skin at bedtime. 11/08/17   Enedina Finner, MD  isosorbide mononitrate (IMDUR) 30 MG 24 hr tablet Take 90 mg by mouth daily.    [provider]  lamoTRIgine (LAMICTAL) 100 MG tablet Take 150 mg by mouth daily.    [provider]  Melatonin 5 MG TABS Take 10 mg by mouth at bedtime as needed (sleep).    [provider]  metoprolol tartrate (LOPRESSOR) 50 MG tablet Take 100 mg by mouth 2 (two) times daily.    [provider]  Omega 3 1000 MG CAPS Take 1,000 mg by mouth every evening.    [provider]  omeprazole (PRILOSEC) 20 MG capsule Take 40 mg by mouth 2 (two) times daily before a meal.    [provider]  QUEtiapine (SEROQUEL) 25 MG tablet Take 25 mg by mouth 2 (two) times daily.    [provider]  QUEtiapine (SEROQUEL) 50 MG tablet Take 50 mg by mouth at bedtime.    [provider]  senna (SENOKOT) 8.6 MG TABS tablet Take 2 tablets by mouth at bedtime as needed for mild constipation.    [provider]  sertraline (ZOLOFT) 100 MG tablet Take 200 mg by mouth at bedtime.    [provider]  sucralfate (CARAFATE) 1 g tablet Take 1 g by mouth 4 (four) times daily.    [provider]  tiotropium (SPIRIVA) 18 MCG inhalation capsule Place 18 mcg into inhaler and inhale daily.    [provider]    Allergies Ace inhibitors; Bactrim [sulfamethoxazole-trimethoprim]; Doxycycline hyclate; Hydrocodone; Oxycodone; and Sulfanilamide  Family History  Problem Relation Age of Onset  . CAD Mother   . Glaucoma Mother   . COPD Father   . COPD Brother   . Parkinsonism Brother     Social History Social History   Tobacco  Use  . Smoking status: Never Smoker  . Smokeless tobacco: Never Used  Substance Use Topics  . Alcohol use: No    Frequency: Never  . Drug use: No    Review of Systems  Constitutional: No fever/chills Eyes: No visual changes. ENT: No sore throat. Cardiovascular: Denies chest pain. Respiratory: As above Gastrointestinal: No abdominal pain.  No nausea, no vomiting.  No diarrhea.  No constipation. Genitourinary: Negative for dysuria. Musculoskeletal: Negative for back pain. Skin: Negative for rash. Neurological: Negative for headaches, focal weakness or numbness.   ____________________________________________   PHYSICAL EXAM:  VITAL SIGNS: ED Triage Vitals  Enc Vitals Group     BP 11/10/17 0909 (!) 101/56  Pulse Rate 11/10/17 0906 81     Resp 11/10/17 0906 (!) 28     Temp 11/10/17 0906 97.8 F (36.6 C)     Temp Source 11/10/17 0906 Oral     SpO2 --      Weight 11/10/17 0908 255 lb (115.7 kg)     Height 11/10/17 0908 5\' 8"  (1.727 m)     Head Circumference --      Peak Flow --      Pain Score 11/10/17 0907 0     Pain Loc --      Pain Edu? --      Excl. in GC? --     Constitutional: Alert and oriented.  Patient speaks in 2-3 word sentences.  Using accessory muscles to breathe. Eyes: Conjunctivae are normal.  Head: Atraumatic. Nose: No congestion/rhinnorhea. Mouth/Throat: Mucous membranes are moist.  Neck: No stridor.   Cardiovascular: Normal rate, regular rhythm. Grossly normal heart sounds.  Respiratory: Patient tachypneic with increased work of breathing, using accessory muscles.  Poor air movement throughout with a prolonged expiratory phase.  Wheezing throughout all fields. Gastrointestinal: Soft and nontender. No distention.  Musculoskeletal: No lower extremity tenderness nor edema.  No joint effusions. Neurologic:  Normal speech and language. No gross focal neurologic deficits are appreciated. Skin:  Skin is warm, dry and intact. No rash  noted. Psychiatric: Mood and affect are normal. Speech and behavior are normal.  ____________________________________________   LABS (all labs ordered are listed, but only abnormal results are displayed)  Labs Reviewed  CBC WITH DIFFERENTIAL/PLATELET - Abnormal; Notable for the following components:      Result Value   WBC 11.8 (*)    RBC 3.15 (*)    Hemoglobin 9.2 (*)    HCT 28.5 (*)    RDW 15.3 (*)    Neutro Abs 10.4 (*)    Lymphs Abs 0.6 (*)    All other components within normal limits  BASIC METABOLIC PANEL  TROPONIN I  BRAIN NATRIURETIC PEPTIDE  BLOOD GAS, ARTERIAL   ____________________________________________  EKG  ED ECG REPORT I, Arelia Longest, the attending physician, personally viewed and interpreted this ECG.   Date: 11/10/2017  EKG Time: 0907  Rate: 90  Rhythm: normal sinus rhythm  Axis: Normal  Intervals:right bundle branch block and left posterior fascicular block  ST&T Change: No ST segment elevation or depression.  T wave inversions in 3, aVF, V3. No significant change in the EKG from November 03, 2017. ____________________________________________  RADIOLOGY  Prominent central vascularity with mild interstitial edema. ____________________________________________   PROCEDURES  Procedure(s) performed:   .Critical Care Performed by: Myrna Blazer, MD Authorized by: Myrna Blazer, MD   Critical care provider statement:    Critical care time (minutes):  35   Critical care was necessary to treat or prevent imminent or life-threatening deterioration of the following conditions:  Respiratory failure   Critical care was time spent personally by me on the following activities:  Ordering and performing treatments and interventions, ordering and review of radiographic studies, pulse oximetry and re-evaluation of patient's condition    Critical Care performed:   ____________________________________________   INITIAL  IMPRESSION / ASSESSMENT AND PLAN / ED COURSE  Pertinent labs & imaging results that were available during my care of the patient were reviewed by me and considered in my medical decision making (see chart for details).  Differential includes, but is not limited to, viral syndrome, bronchitis including COPD exacerbation, pneumonia, reactive airway disease including  asthma, CHF including exacerbation with or without pulmonary/interstitial edema, pneumothorax, ACS, thoracic trauma, and pulmonary embolism. As part of my medical decision making, I reviewed the following data within the electronic MEDICAL RECORD NUMBER Notes from prior ED visits  Patient with significant increased work of breathing.  Placed on BiPAP.    ----------------------------------------- 9:48 AM on 11/10/2017 -----------------------------------------  Patient tolerating the BiPAP well.  However, due to the respiratory distress the patient will need to be admitted.  She is receiving treatment now for COPD.  Will hold on diuresis secondary to the patient's soft blood pressures.  However, we will give IV magnesium to help with airway dilatation.  Patient to be admitted.  Signed out to Dr. Hilton SinclairWeiting. ____________________________________________   FINAL CLINICAL IMPRESSION(S) / ED DIAGNOSES  COPD.  CHF.    NEW MEDICATIONS STARTED DURING THIS VISIT:  New Prescriptions   No medications on file     Note:  This document was prepared using Dragon voice recognition software and may include unintentional dictation errors.     Myrna BlazerSchaevitz, Mylo Choi Matthew, MD 11/10/17 [phone]

## 2017-11-10 NOTE — ED Notes (Signed)
Notified Resp of transport. Awaiting Resp.

## 2017-11-10 NOTE — Progress Notes (Signed)
Family Meeting Note  Advance Directive:yes  Today a meeting took place with the Patient and daughter Avon Gully( HCPOA).  The following clinical team members were present during this meeting:MD  The following were discussed:Patient's diagnosis: ac on ch respi failure, COPD, CHF , Patient's progosis: Unable to determine and Goals for treatment: DNR  Additional follow-up to be provided: Intensivist  Time spent during discussion:20 minutes  Altamese DillingVaibhavkumar Cassian Torelli, MD

## 2017-11-10 NOTE — H&P (Signed)
[employer] [facility] [place] [facility]   PATIENT NAME: Cathy Guzman    MR#:  [id number]  DATE OF BIRTH:  1946/03/28  DATE OF ADMISSION:  11/10/2017  PRIMARY CARE PHYSICIAN: Dorothey Baseman, MD   REQUESTING/REFERRING PHYSICIAN: [place]  CHIEF COMPLAINT:   Chief Complaint  Patient presents with  . Shortness of Breath    HISTORY OF PRESENT ILLNESS: Cathy Guzman  is a 72 y.o. female with a known history of acute myocardial infarction, bipolar disorder, diastolic congestive heart failure, essential hypertension, gout, hyperlipidemia, iron deficiency anemia, COPD on chronic home oxygen use, type 2 diabetes- has repeated admissions in last 1 month- initially at [facility] and last week admitted here with hyponatremia secondary to multiple diuretics use, which improved after holding the diuretics and she was sent to rehabilitation 2 days ago without diuretics prescriptions. She also had acute on chronic renal failure and was advised to follow with nephrology clinic in one week. At baseline she is on 2 L oxygen via nasal cannula. Since yesterday at rehabilitation she is more short of breath, requiring up to 4 L oxygen and having tachypnea so today to send her to emergency room. She was noted to have some pulmonary edema and was having increased work of breathing so started on BiPAP by ER physician. She denies drinking excessive liquids, her total fluid intake in a day is around 4 small cups of liquids.  PAST MEDICAL HISTORY:   Past Medical History:  Diagnosis Date  . Acute myocardial infarction, subendocardial infarction, initial episode of care (HCC)   . Anginal syndrome (HCC)   . Atopic rhinitis   . Avitaminosis D   . Bacteremia   . Better eye: severe vision impairment; lesser eye: blind   . Bipolar disorder, unspecified (HCC)   . Cannot walk   . CHF (congestive heart failure) (HCC)   . CN (constipation)   . Coronary atherosclerosis of native coronary artery   . Encounter for  vocational therapy   . Esophageal reflux   . Essential hypertension, malignant   . Gout, unspecified   . Heart failure, diastolic (HCC)   . Hyperlipemia   . Hypomagnesemia   . Hypopotassemia   . Iron deficiency anemia, unspecified   . Muscle weakness (generalized)   . Obstructive chronic bronchitis without exacerbation (HCC)   . Oropharyngeal dysphagia   . Persistent disorder of initiating or maintaining sleep   . Rash and other nonspecific skin eruption   . Scar painful   . Scissor gait   . Shortness of breath   . Spasm of muscle   . Type II diabetes mellitus with renal manifestations (HCC)   . Urinary tract infection, site not specified     PAST SURGICAL HISTORY:  Past Surgical History:  Procedure Laterality Date  . ABDOMINAL HYSTERECTOMY    . ABDOMINAL SURGERY    . CHOLECYSTECTOMY    . GASTRIC BYPASS      SOCIAL HISTORY:  Social History   Tobacco Use  . Smoking status: Never Smoker  . Smokeless tobacco: Never Used  Substance Use Topics  . Alcohol use: No    Frequency: Never    FAMILY HISTORY:  Family History  Problem Relation Age of Onset  . CAD Mother   . Glaucoma Mother   . COPD Father   . COPD Brother   . Parkinsonism Brother     DRUG ALLERGIES:  Allergies  Allergen Reactions  . Ace Inhibitors     unknown  . Bactrim [  Sulfamethoxazole-Trimethoprim]     unknown  . Doxycycline Hyclate     unknown  . Hydrocodone     unknown  . Oxycodone     unknown  . Sulfanilamide     unknown    REVIEW OF SYSTEMS:   CONSTITUTIONAL: No fever, positive for fatigue or weakness.  EYES: No blurred or double vision.  EARS, NOSE, AND THROAT: No tinnitus or ear pain.  RESPIRATORY: No cough, have shortness of breath, no wheezing or hemoptysis.  CARDIOVASCULAR: No chest pain, orthopnea, edema.  GASTROINTESTINAL: No nausea, vomiting, diarrhea or abdominal pain.  GENITOURINARY: No dysuria, hematuria.  ENDOCRINE: No polyuria, nocturia,  HEMATOLOGY: No anemia, easy  bruising or bleeding SKIN: No rash or lesion. MUSCULOSKELETAL: No joint pain or arthritis.   NEUROLOGIC: No tingling, numbness, weakness.  PSYCHIATRY: No anxiety or depression.   MEDICATIONS AT HOME:  Prior to Admission medications   Medication Sig Start Date End Date Taking? Authorizing Provider  albuterol (PROVENTIL HFA;VENTOLIN HFA) 108 (90 Base) MCG/ACT inhaler Inhale 2 puffs into the lungs every 3 (three) hours as needed for wheezing or shortness of breath.   Yes [provider]  allopurinol (ZYLOPRIM) 100 MG tablet Take 100 mg by mouth daily.   Yes [provider]  alum & mag hydroxide-simeth (MAALOX/MYLANTA) 200-200-20 MG/5ML suspension Take by mouth every 4 (four) hours as needed for indigestion or heartburn.   Yes [provider]  amLODipine (NORVASC) 10 MG tablet Take 10 mg by mouth daily.   Yes [provider]  ARIPiprazole (ABILIFY) 20 MG tablet Take 20 mg by mouth daily.   Yes [provider]  aspirin EC 81 MG tablet Take 81 mg by mouth daily.   Yes [provider]  atorvastatin (LIPITOR) 80 MG tablet Take 80 mg by mouth daily.   Yes [provider]  clopidogrel (PLAVIX) 75 MG tablet Take 75 mg by mouth daily.   Yes [provider]  diclofenac sodium (VOLTAREN) 1 % GEL Apply 2 g topically 4 (four) times daily.   Yes [provider]  dorzolamide (TRUSOPT) 2 % ophthalmic solution Place 1 drop into the right eye 2 (two) times daily.   Yes [provider]  ferrous sulfate 325 (65 FE) MG EC tablet Take 325 mg by mouth daily with breakfast.   Yes [provider]  fluticasone (FLONASE) 50 MCG/ACT nasal spray Place 1 spray into both nostrils daily.   Yes [provider]  Fluticasone-Salmeterol (ADVAIR) 500-50 MCG/DOSE AEPB Inhale 1 puff into the lungs 2 (two) times daily.   Yes [provider]  insulin glargine (LANTUS) 100 UNIT/ML injection Inject 0.1 mLs (10 Units total)  into the skin at bedtime. 11/08/17  Yes Enedina Finner, MD  isosorbide mononitrate (IMDUR) 30 MG 24 hr tablet Take 90 mg by mouth daily.   Yes [provider]  lamoTRIgine (LAMICTAL) 100 MG tablet Take 150 mg by mouth daily.   Yes [provider]  Melatonin 10 MG TABS Take 10 mg by mouth at bedtime as needed (for sleeping).   Yes [provider]  metoprolol tartrate (LOPRESSOR) 50 MG tablet Take 100 mg by mouth 2 (two) times daily.   Yes [provider]  Omega 3 1000 MG CAPS Take 1,000 mg by mouth every evening.   Yes [provider]  omega-3 acid ethyl esters (LOVAZA) 1 g capsule Take 1 g by mouth daily.   Yes [provider]  omeprazole (PRILOSEC) 20 MG capsule Take 40  mg by mouth 2 (two) times daily before a meal.   Yes [provider]  polyethylene glycol (MIRALAX / GLYCOLAX) packet Take 17 g by mouth daily.   Yes [provider]  QUEtiapine (SEROQUEL) 25 MG tablet Take 25 mg by mouth 2 (two) times daily.   Yes [provider]  QUEtiapine (SEROQUEL) 50 MG tablet Take 50 mg by mouth at bedtime.   Yes [provider]  senna (SENOKOT) 8.6 MG TABS tablet Take 2 tablets by mouth at bedtime as needed for mild constipation.   Yes [provider]  sertraline (ZOLOFT) 100 MG tablet Take 200 mg by mouth at bedtime.   Yes [provider]  sucralfate (CARAFATE) 1 g tablet Take 1 g by mouth 4 (four) times daily.   Yes [provider]  tiotropium (SPIRIVA) 18 MCG inhalation capsule Place 18 mcg into inhaler and inhale daily.   Yes [provider]      PHYSICAL EXAMINATION:   VITAL SIGNS: Blood pressure (!) 101/56, pulse 81, temperature 97.8 F (36.6 C), temperature source Oral, resp. rate (!) 28, height 5\' 8"  (1.727 m), weight 115.7 kg (255 lb).  GENERAL:  72 y.o.-year-old patient lying in the bed with acute distress.  EYES: Pupils equal, round, reactive to light and accommodation.  No scleral icterus. Extraocular muscles intact.  HEENT: Head atraumatic, normocephalic. Oropharynx and nasopharynx clear.  NECK:  Supple, no jugular venous distention. No thyroid enlargement, no tenderness.  LUNGS: Normal breath sounds bilaterally, bilateral wheezing, some crepitation. Positive use of accessory muscles of respiration. BiPAP in use. CARDIOVASCULAR: S1, S2 normal. No murmurs, rubs, or gallops.  ABDOMEN: Soft, nontender, nondistended. Bowel sounds present. No organomegaly or mass.  EXTREMITIES: No pedal edema, cyanosis, or clubbing.  NEUROLOGIC: Cranial nerves II through XII are intact. Muscle strength 3-4/5 in all extremities. Sensation intact. Gait not checked.  PSYCHIATRIC: The patient is alert and oriented x 3.  SKIN: No obvious rash, lesion, or ulcer.   LABORATORY PANEL:   CBC Recent Labs  Lab 11/03/17 2050 11/04/17 0703 11/07/17 0816 11/10/17 0904  WBC 6.9 6.3 6.7 11.8*  HGB 9.4* 9.0* 8.6* 9.2*  HCT 26.7* 26.7* 26.3* 28.5*  PLT 220 186 223 263  MCV 87.9 88.0 88.9 90.4  MCH 30.8 29.6 29.1 29.3  MCHC 35.0 33.6 32.7 32.4  RDW 15.2* 15.0* 14.8* 15.3*  LYMPHSABS  --   --   --  0.6*  MONOABS  --   --   --  0.7  EOSABS  --   --   --  0.1  BASOSABS  --   --   --  0.0   ------------------------------------------------------------------------------------------------------------------  Chemistries  Recent Labs  Lab 11/03/17 2050 11/04/17 0703 11/04/17 2131 11/06/17 0935 11/06/17 1809 11/07/17 0816 11/08/17 0509  NA 119* 120* 119* 124* 123* 123* 125*  K 3.8 3.4*  --   --   --  3.6  --   CL 77* 80*  --   --   --  82*  --   CO2 31 31  --   --   --  32  --   GLUCOSE 131* 114*  --   --   --  121*  --   BUN 65* 65*  --   --   --  60*  --   CREATININE 3.36* 3.28*  --   --   --  2.74*  --   CALCIUM 8.7* 8.4*  --   --   --  8.6*  --   AST 13*  --   --   --   --  15  --   ALT 11*  --   --   --   --  9*  --   ALKPHOS 89  --   --   --   --  89  --   BILITOT 0.5   --   --   --   --  0.5  --    ------------------------------------------------------------------------------------------------------------------ estimated creatinine clearance is 24.4 mL/min (A) (by C-G formula based on SCr of 2.74 mg/dL (H)). ------------------------------------------------------------------------------------------------------------------ No results for input(s): TSH, T4TOTAL, T3FREE, THYROIDAB in the last 72 hours.  Invalid input(s): FREET3   Coagulation profile No results for input(s): INR, PROTIME in the last 168 hours. ------------------------------------------------------------------------------------------------------------------- No results for input(s): DDIMER in the last 72 hours. -------------------------------------------------------------------------------------------------------------------  Cardiac Enzymes Recent Labs  Lab 11/10/17 0904  TROPONINI 0.05*   ------------------------------------------------------------------------------------------------------------------ Invalid input(s): POCBNP  ---------------------------------------------------------------------------------------------------------------  Urinalysis    Component Value Date/Time   COLORURINE YELLOW (A) 11/03/2017 2050   APPEARANCEUR CLEAR (A) 11/03/2017 2050   LABSPEC 1.009 11/03/2017 2050   PHURINE 6.0 11/03/2017 2050   GLUCOSEU NEGATIVE 11/03/2017 2050   HGBUR NEGATIVE 11/03/2017 2050   BILIRUBINUR NEGATIVE 11/03/2017 2050   KETONESUR NEGATIVE 11/03/2017 2050   PROTEINUR NEGATIVE 11/03/2017 2050   NITRITE NEGATIVE 11/03/2017 2050   LEUKOCYTESUR NEGATIVE 11/03/2017 2050     RADIOLOGY: Dg Chest 1 View  Result Date: 11/10/2017 CLINICAL DATA:  Shortness of Breath EXAM: CHEST 1 VIEW COMPARISON:  None. FINDINGS: Cardiac shadow is mildly enlarged. Prominent central vascularity is noted with some mild interstitial edema. Bibasilar atelectatic changes are seen. No sizable effusion  is noted. No acute bony abnormality is seen. IMPRESSION: Prominent central vascularity with mild interstitial edema. Electronically Signed   By: Alcide Clever M.D.   On: 11/10/2017 09:23    EKG: Orders placed or performed during the hospital encounter of 11/10/17  . ED EKG  . ED EKG  . EKG 12-Lead  . EKG 12-Lead    IMPRESSION AND PLAN:  * Acute on chronic respiratory failure with hypoxia   This is secondary to COPD and CHF   Currently on BiPAP, treat underlying reasons and monitor in stepdown.   I discussed the case with the intensivist on phone.    * Ac COPD exacerbation   IV and inhaled steroids.   Nebulizer treatments.   Currently no fever or sputum production, I will check pro calcitonin but no need for antibiotics for now.  * Acute on chronic diastolic congestive heart failure   Her Lasix and metolazone Stopped in last admission   I spoke to lab, her sodium is 133 and creatinine 2.1.   Slight pulmonary edema on x-ray, no edema on legs.   Will give IV Lasix one time and we can decide starting it on discharge.  * Chronic renal failure stage 4   Monitor in hospital, we may need to reevaluate need of Lasix on discharge.  * History of coronary artery disease   Continue aspirin, Plavix, atorvastatin   Hold metoprolol for now his blood pressure is running borderline.  * Hypertension   Hold medications for now as blood pressure is borderline.  * Diabetes   Continue Lantus, keep on sliding scale coverage.  All the records are reviewed and case discussed with ED provider. Management plans discussed with the patient, family and they are in agreement.  CODE STATUS: DO NOT RESUSCITATE Code  Status History    Date Active Date Inactive Code Status Order ID Comments User Context   11/04/2017 01:34 11/08/2017 13:33 Full Code [id number]  Oralia ManisWillis, David, MD Inpatient    Advance Directive Documentation     Most Recent Value  Type of Advance Directive  Healthcare Power of Attorney   Pre-existing out of facility DNR order (yellow form or pink MOST form)  No data  "MOST" Form in Place?  No data     Patient's daughter present in the room during my visit.  TOTAL TIME TAKING CARE OF THIS PATIENT: 50 minutes.    Altamese DillingVaibhavkumar Evangelyne Loja M.D on 11/10/2017   Between 7am to 6pm - Pager - [phone]  After 6pm go to [email or web address] - password [email or web address] [email or web address]  [facility]  Office  [phone]  CC: Primary care physician; Dorothey BasemanBronstein, David, MD   Note: This dictation was prepared with Dragon dictation along with smaller phrase technology. Any transcriptional errors that result from this process are unintentional.

## 2017-11-10 NOTE — ED Triage Notes (Signed)
Pt present to the ER via [facility] from [facility] with complaints of increased SOB. Pt given nebulizer at [facility] with no relief. EMS also gave her 2 duo nebs. Pt has wheezing throught out. She does normally wear 2L [place] they bumped her up to 4L but state that her O2 was still 80's

## 2017-11-11 ENCOUNTER — Inpatient Hospital Stay: Payer: Medicare Other

## 2017-11-11 LAB — TROPONIN I
TROPONIN I: 0.08 ng/mL — AB (ref <0.03)
Troponin I: 0.49 ng/mL (ref <0.03)
Troponin I: 3.07 ng/mL (ref <0.03)

## 2017-11-11 LAB — CBC
HCT: 27 % — ABNORMAL LOW (ref 35.0–47.0)
HCT: 26 % — ABNORMAL LOW (ref 35.0–47.0)
HEMOGLOBIN: 8.8 g/dL — AB (ref 12.0–16.0)
HEMOGLOBIN: 8.6 g/dL — AB (ref 12.0–16.0)
MCH: 29.4 pg (ref 26.0–34.0)
MCH: 29.5 pg (ref 26.0–34.0)
MCHC: 32.6 g/dL (ref 32.0–36.0)
MCHC: 33.2 g/dL (ref 32.0–36.0)
MCV: 90.2 fL (ref 80.0–100.0)
MCV: 88.9 fL (ref 80.0–100.0)
PLATELETS: 238 10*3/uL (ref 150–440)
Platelets: 309 10*3/uL (ref 150–440)
RBC: 2.99 MIL/uL — ABNORMAL LOW (ref 3.80–5.20)
RBC: 2.92 MIL/uL — ABNORMAL LOW (ref 3.80–5.20)
RDW: 15.3 % — AB (ref 11.5–14.5)
RDW: 15.6 % — AB (ref 11.5–14.5)
WBC: 8.9 10*3/uL (ref 3.6–11.0)
WBC: 11.1 10*3/uL — ABNORMAL HIGH (ref 3.6–11.0)

## 2017-11-11 LAB — GLUCOSE, CAPILLARY
GLUCOSE-CAPILLARY: 172 mg/dL — AB (ref 65–99)
GLUCOSE-CAPILLARY: 233 mg/dL — AB (ref 65–99)
GLUCOSE-CAPILLARY: 220 mg/dL — AB (ref 65–99)
GLUCOSE-CAPILLARY: 177 mg/dL — AB (ref 65–99)

## 2017-11-11 LAB — BASIC METABOLIC PANEL
Anion gap: 13 (ref 5–15)
BUN: 52 mg/dL — ABNORMAL HIGH (ref 6–20)
CALCIUM: 9.4 mg/dL (ref 8.9–10.3)
CO2: 30 mmol/L (ref 22–32)
CREATININE: 2.3 mg/dL — AB (ref 0.44–1.00)
Chloride: 90 mmol/L — ABNORMAL LOW (ref 101–111)
GFR calc Af Amer: 23 mL/min — ABNORMAL LOW (ref >60)
GFR, EST NON AFRICAN AMERICAN: 20 mL/min — AB (ref >60)
GLUCOSE: 194 mg/dL — AB (ref 65–99)
Potassium: 4.3 mmol/L (ref 3.5–5.1)
Sodium: 133 mmol/L — ABNORMAL LOW (ref 135–145)

## 2017-11-11 LAB — PROTIME-INR
INR: 1.35
PROTHROMBIN TIME: 16.6 s — AB (ref 11.4–15.2)

## 2017-11-11 LAB — APTT: aPTT: 153 seconds — ABNORMAL HIGH (ref 24–36)

## 2017-11-11 MED ORDER — METOPROLOL TARTRATE 25 MG PO TABS
12.5000 mg | ORAL_TABLET | Freq: Two times a day (BID) | ORAL | Status: DC
  Administered 2017-11-11 – 2017-11-14 (×7): 12.5 mg via ORAL
  Filled 2017-11-11 (×7): qty 1

## 2017-11-11 MED ORDER — HEPARIN BOLUS VIA INFUSION
2000.0000 [IU] | Freq: Once | INTRAVENOUS | Status: AC
  Administered 2017-11-11: 2000 [IU] via INTRAVENOUS
  Filled 2017-11-11: qty 2000

## 2017-11-11 MED ORDER — HEPARIN (PORCINE) IN NACL 100-0.45 UNIT/ML-% IJ SOLN
1100.0000 [IU]/h | INTRAMUSCULAR | Status: DC
  Administered 2017-11-11: 1250 [IU]/h via INTRAVENOUS
  Filled 2017-11-11: qty 250

## 2017-11-11 MED ORDER — NITROGLYCERIN 0.4 MG SL SUBL: 0.4000 mg | SUBLINGUAL_TABLET | SUBLINGUAL | Status: DC | PRN

## 2017-11-11 MED ORDER — GI COCKTAIL ~~LOC~~
30.0000 mL | Freq: Once | ORAL | Status: AC
  Administered 2017-11-11: 30 mL via ORAL
  Filled 2017-11-11: qty 30

## 2017-11-11 MED ORDER — ISOSORBIDE MONONITRATE ER 30 MG PO TB24
30.0000 mg | ORAL_TABLET | Freq: Every day | ORAL | Status: DC
  Administered 2017-11-11 – 2017-11-12 (×2): 30 mg via ORAL
  Filled 2017-11-11 (×5): qty 1

## 2017-11-11 MED ORDER — ACETAMINOPHEN 325 MG PO TABS
650.0000 mg | ORAL_TABLET | Freq: Four times a day (QID) | ORAL | Status: DC | PRN
  Administered 2017-11-11 – 2017-11-13 (×5): 650 mg via ORAL
  Filled 2017-11-11 (×5): qty 2

## 2017-11-11 MED ORDER — METHYLPREDNISOLONE SODIUM SUCC 40 MG IJ SOLR
40.0000 mg | Freq: Four times a day (QID) | INTRAMUSCULAR | Status: DC
  Administered 2017-11-11 – 2017-11-12 (×4): 40 mg via INTRAVENOUS
  Filled 2017-11-11 (×5): qty 1

## 2017-11-11 NOTE — Progress Notes (Signed)
PULMONARY / CRITICAL CARE MEDICINE   Name: Cathy KassCynthia Guzman MRN: [id number] DOB: 01/06/1946    ADMISSION DATE:  11/10/2017 CONSULTATION DATE:  11/10/2017 REFERRING MD:  Dr. Elisabeth PigeonVachhani  CHIEF COMPLAINT:  Shortness of breath  HISTORY OF PRESENT ILLNESS:   [age] F with PMH as below significant for COPD, CHF, CKD IV, presented to the ED this morning with 2 days of acutely worsening SOB. [facility] after a 5-day admission for weakness and hyponatremia. The patient's hyponatremia was attributed to diuretic usage at that time (she was on both lasix and metolazone). She was subsequently discharged home without diuretics.  Pt denies chest pain, but endorses cough productive of "red" sputum. No fevers chills or sweats. On exam, she is a&ox4. WOB is normal on NIV. BS decreased with crackles anteriorly. 1+ LE edema.    SUBJECTIVE:  No acute issues over night. Still on continuous BiPAP but able to transition to [place] this morning. Complained of chest pain that resolved with Mylanta.   VITAL SIGNS: BP (!) 100/57   Pulse 83   Temp 98.5 F (36.9 C) (Oral)   Resp 14   Ht 5\' 8"  (1.727 m)   Wt 119 kg (262 lb 5.6 oz)   SpO2 99%   BMI 39.89 kg/m   HEMODYNAMICS:    VENTILATOR SETTINGS: FiO2 (%):  [35 %] 35 %  INTAKE / OUTPUT: I/O last 3 completed shifts: In: -  Out: 100 [Urine:100]  PHYSICAL EXAMINATION: General:  Ill-appearing, non-distressed Neuro:  Grossly non-focal, a&ox4 HEENT:  NCAT Cardiovascular:  S1S2 RRR, no m/g/r, 1+ peripheral pulses Lungs:  Decreased BS with bilateral crackles anteriorly Abdomen:  Obese, soft non-tender Musculoskeletal:  No gross deformity Skin:  Warm, dry, acyanotic  LABS:  BMET Recent Labs  Lab 11/07/17 0816 11/08/17 0509 11/10/17 0904 11/11/17 0428  NA 123* 125* 133* 133*  K 3.6  --  4.7 4.3  CL 82*  --  89* 90*  CO2 32  --  32 30  BUN 60*  --  45* 52*  CREATININE 2.74*  --  2.18* 2.30*  GLUCOSE 121*  --  167* 194*     Electrolytes Recent Labs  Lab 11/07/17 0816 11/10/17 0904 11/11/17 0428  CALCIUM 8.6* 9.9 9.4    CBC Recent Labs  Lab 11/07/17 0816 11/10/17 0904 11/11/17 0428  WBC 6.7 11.8* 8.9  HGB 8.6* 9.2* 8.8*  HCT 26.3* 28.5* 27.0*  PLT 223 263 238    Coag's No results for input(s): APTT, INR in the last 168 hours.  Sepsis Markers Recent Labs  Lab 11/10/17 0904  PROCALCITON 0.13    ABG Recent Labs  Lab 11/10/17 0948  PHART 7.36  PCO2ART 60*  PO2ART 62*    Liver Enzymes Recent Labs  Lab 11/07/17 0816  AST 15  ALT 9*  ALKPHOS 89  BILITOT 0.5  ALBUMIN 2.9*    Cardiac Enzymes Recent Labs  Lab 11/10/17 0904  TROPONINI 0.05*    Glucose Recent Labs  Lab 11/07/17 1718 11/07/17 2116 11/08/17 0729 11/10/17 1201 11/10/17 1651 11/10/17 2251  GLUCAP 144* 160* 114* 159* 204* 218*    Imaging Dg Chest 1 View  Result Date: 11/10/2017 CLINICAL DATA:  Shortness of Breath EXAM: CHEST 1 VIEW COMPARISON:  None. FINDINGS: Cardiac shadow is mildly enlarged. Prominent central vascularity is noted with some mild interstitial edema. Bibasilar atelectatic changes are seen. No sizable effusion is noted. No acute bony abnormality is seen. IMPRESSION: Prominent central vascularity with mild  interstitial edema. Electronically Signed   By: Alcide Clever M.D.   On: 11/10/2017 09:23    STUDIES:  2/1 CXR with bilateral pleural effusions and perihilar consolidation c/w pulmonary edema  SIGNIFICANT EVENTS: 1/25--admitted to [facility] with weakness and hypontremia attributed to diuretic use 1/30--discharged to SNF without diuretics 2/1--pt presents to ED with progressive SOB since discharge, hypoxic requiring NIV. CXR c/w pulmonary edema. PCT negative. Admitted to ICU on NIV.   LINES/TUBES:   DISCUSSION: Exam, labs, and imaging most consistent with acute CHF exacerbation. Pt has not had diuretics in the past two days since discharge. ? Component of COPD, no evidence for PNA.     ASSESSMENT / PLAN:  PULMONARY A: Acute hypoxic respiratory failure 2/2 pulmonary edema H/O COPD P:   Will diuresis with diamox initially After that, restart lasix Continue bronchodilators Discontinue systemic steroids  CARDIOVASCULAR A:  Acute exacerbation of HFpEF H/O Dyslipidemia H/O CAD P:  Continue IV Diuresis  Continue atorvastatin Continue ASA Will try trial off of NIV BiPAP PRN Supplemental O2 PRN, spO2 goal 88-92%  RENAL A:   Chronic Kidney Disease, stage 4 baseline Cr 2.95 Chronic Respiratory Acidosis P:   Follow BMP carefully with diuresis  GASTROINTESTINAL A:   H/O GERD P:   Continue pantoprazole   HEMATOLOGIC A:   Mild anemia in the setting of CKD P:  Follow CBC [place] heparin for DVT prophylaxis   INFECTIOUS A:   No issues Negative PCT P:    ENDOCRINE A: H/O T2DM    P:   SSI Lantus at bedtime  NEUROLOGIC A:   No issues P:    PSYCHIATRIC A: H/O Bipolar d/o  P: Continue lamictal Continue seroquel Continue zoloft Continue abilify  FAMILY  - Updates: daughter at bedside, updated in detail  Magdalene S. [facility] ANP-BC Pulmonary and Critical Care Medicine [facility] Pager [phone] or [phone]  NB: This document was prepared using Dragon voice recognition software and may include unintentional dictation errors.   11/11/2017, 5:13 AM

## 2017-11-11 NOTE — Progress Notes (Signed)
Dr. Peggye Pittichards notified of elevated troponin of 3.07, acknowledged. No new orders currently. Continue to monitor.

## 2017-11-11 NOTE — Progress Notes (Signed)
Placed pt n/c 6lpm sat 96% per m.tukov , BIPAP REMAINS AT BEDSIDE STANDING BY

## 2017-11-11 NOTE — Progress Notes (Signed)
ANTICOAGULATION CONSULT NOTE - Initial Consult  Pharmacy Consult for heparin gtt Indication: chest pain/ACS  Allergies  Allergen Reactions  . Ace Inhibitors     unknown  . Bactrim [Sulfamethoxazole-Trimethoprim] Itching  . Doxycycline Hyclate     unknown  . Hydrocodone     unknown  . Oxycodone     unknown  . Sulfanilamide     unknown    Patient Measurements: Height: 5\' 8"  (172.7 cm) Weight: 262 lb 5.6 oz (119 kg) IBW/kg (Calculated) : 63.9 Heparin Dosing Weight: 91.6kg  Vital Signs: Temp: 97.6 F (36.4 C) (02/02 1900) BP: 122/72 (02/02 2000) Pulse Rate: 86 (02/02 1950)  Labs: Recent Labs    11/10/17 0904 11/11/17 0428 11/11/17 0739 11/11/17 1240 11/11/17 1843  HGB 9.2* 8.8*  --   --   --   HCT 28.5* 27.0*  --   --   --   PLT 263 238  --   --   --   CREATININE 2.18* 2.30*  --   --   --   TROPONINI 0.05*  --  0.08* 0.49* 3.07*    Estimated Creatinine Clearance: 29.5 mL/min (A) (by C-G formula based on SCr of 2.3 mg/dL (H)).   Medical History: Past Medical History:  Diagnosis Date  . Acute myocardial infarction, subendocardial infarction, initial episode of care (HCC)   . Anginal syndrome (HCC)   . Atopic rhinitis   . Avitaminosis D   . Bacteremia   . Better eye: severe vision impairment; lesser eye: blind   . Bipolar disorder, unspecified (HCC)   . Cannot walk   . CHF (congestive heart failure) (HCC)   . CN (constipation)   . Coronary atherosclerosis of native coronary artery   . Encounter for vocational therapy   . Esophageal reflux   . Essential hypertension, malignant   . Gout, unspecified   . Heart failure, diastolic (HCC)   . Hyperlipemia   . Hypomagnesemia   . Hypopotassemia   . Iron deficiency anemia, unspecified   . Muscle weakness (generalized)   . Obstructive chronic bronchitis without exacerbation (HCC)   . Oropharyngeal dysphagia   . Persistent disorder of initiating or maintaining sleep   . Rash and other nonspecific skin  eruption   . Scar painful   . Scissor gait   . Shortness of breath   . Spasm of muscle   . Type II diabetes mellitus with renal manifestations (HCC)   . Urinary tract infection, site not specified     Medications:  Medications Prior to Admission  Medication Sig Dispense Refill Last Dose  . albuterol (PROVENTIL HFA;VENTOLIN HFA) 108 (90 Base) MCG/ACT inhaler Inhale 2 puffs into the lungs every 3 (three) hours as needed for wheezing or shortness of breath.   prn at prn  . allopurinol (ZYLOPRIM) 100 MG tablet Take 100 mg by mouth daily.   11/10/2017 at 0800  . alum & mag hydroxide-simeth (MAALOX/MYLANTA) 200-200-20 MG/5ML suspension Take by mouth every 4 (four) hours as needed for indigestion or heartburn.   prn at prn  . amLODipine (NORVASC) 10 MG tablet Take 10 mg by mouth daily.   11/10/2017 at 0800  . ARIPiprazole (ABILIFY) 20 MG tablet Take 20 mg by mouth daily.   11/10/2017 at 0800  . aspirin EC 81 MG tablet Take 81 mg by mouth daily.   11/10/2017 at 0800  . atorvastatin (LIPITOR) 80 MG tablet Take 80 mg by mouth daily.   11/10/2017 at 0800  . clopidogrel (PLAVIX) 75  MG tablet Take 75 mg by mouth daily.   11/10/2017 at 0800  . diclofenac sodium (VOLTAREN) 1 % GEL Apply 2 g topically 4 (four) times daily.   11/09/2017 at 1700  . dorzolamide (TRUSOPT) 2 % ophthalmic solution Place 1 drop into the right eye 2 (two) times daily.   11/09/2017 at 2100  . ferrous sulfate 325 (65 FE) MG EC tablet Take 325 mg by mouth daily with breakfast.   11/10/2017 at 0800  . fluticasone (FLONASE) 50 MCG/ACT nasal spray Place 1 spray into both nostrils daily.   11/10/2017 at 0800  . Fluticasone-Salmeterol (ADVAIR) 500-50 MCG/DOSE AEPB Inhale 1 puff into the lungs 2 (two) times daily.   11/10/2017 at 0800  . insulin glargine (LANTUS) 100 UNIT/ML injection Inject 0.1 mLs (10 Units total) into the skin at bedtime. 10 mL 11 11/09/2017 at 2100  . isosorbide mononitrate (IMDUR) 30 MG 24 hr tablet Take 90 mg by mouth daily.   11/10/2017  at 0800  . lamoTRIgine (LAMICTAL) 100 MG tablet Take 150 mg by mouth daily.   11/10/2017 at 0800  . Melatonin 10 MG TABS Take 10 mg by mouth at bedtime as needed (for sleeping).   prn at prn  . metoprolol tartrate (LOPRESSOR) 50 MG tablet Take 100 mg by mouth 2 (two) times daily.   11/10/2017 at 0800  . Omega 3 1000 MG CAPS Take 1,000 mg by mouth every evening.   11/09/2017 at 1800  . omega-3 acid ethyl esters (LOVAZA) 1 g capsule Take 1 g by mouth daily.   11/09/2017 at 1800  . omeprazole (PRILOSEC) 20 MG capsule Take 40 mg by mouth 2 (two) times daily before a meal.   11/10/2017 at 0800  . polyethylene glycol (MIRALAX / GLYCOLAX) packet Take 17 g by mouth daily.   11/09/2017 at 1800  . QUEtiapine (SEROQUEL) 25 MG tablet Take 25 mg by mouth 2 (two) times daily.   11/10/2017 at 0600  . QUEtiapine (SEROQUEL) 50 MG tablet Take 50 mg by mouth at bedtime.   11/09/2017 at 2000  . senna (SENOKOT) 8.6 MG TABS tablet Take 2 tablets by mouth at bedtime as needed for mild constipation.   prn at prn  . sertraline (ZOLOFT) 100 MG tablet Take 200 mg by mouth at bedtime.   11/09/2017 at 2000  . sucralfate (CARAFATE) 1 g tablet Take 1 g by mouth 4 (four) times daily.   11/10/2017 at 0800  . tiotropium (SPIRIVA) 18 MCG inhalation capsule Place 18 mcg into inhaler and inhale daily.   11/09/2017 at 0800   Scheduled:  . allopurinol  100 mg Oral Daily  . ARIPiprazole  20 mg Oral Daily  . aspirin EC  81 mg Oral Daily  . atorvastatin  80 mg Oral Daily  . budesonide (PULMICORT) nebulizer solution  0.25 mg Nebulization BID  . chlorhexidine  15 mL Mouth Rinse BID  . clopidogrel  75 mg Oral Daily  . diclofenac sodium  2 g Topical QID  . dorzolamide  1 drop Right Eye BID  . ferrous sulfate  325 mg Oral Q breakfast  . fluticasone  1 spray Each Nare Daily  . furosemide  40 mg Oral BID  . heparin  2,000 Units Intravenous Once  . insulin aspart  0-9 Units Subcutaneous TID WC  . insulin glargine  10 Units Subcutaneous QHS  .  ipratropium-albuterol  3 mL Nebulization Q4H  . isosorbide mononitrate  30 mg Oral Daily  . lamoTRIgine  150  mg Oral Daily  . mouth rinse  15 mL Mouth Rinse q12n4p  . methylPREDNISolone (SOLU-MEDROL) injection  40 mg Intravenous Q6H  . metoprolol tartrate  12.5 mg Oral BID  . omega-3 acid ethyl esters  1,000 mg Oral QPM  . pantoprazole  40 mg Oral BID AC  . polyethylene glycol  17 g Oral Daily  . QUEtiapine  25 mg Oral BID  . sertraline  200 mg Oral QHS  . sucralfate  1 g Oral TID AC & HS  . tiotropium  18 mcg Inhalation Daily   Infusions:  . heparin     PRN: acetaminophen, alum & mag hydroxide-simeth, diphenhydrAMINE, docusate sodium, Melatonin, nitroGLYCERIN, senna Anti-infectives (From admission, onward)   None      Assessment: 72 year old female who just got heparin 5000units subq, started on heparin gtt for ACS/Chest pain with rising troponins. Will give half bolus and start on usual heparin rate per protocol    Goal of Therapy:  Heparin level 0.3-0.7 units/ml Monitor platelets by anticoagulation protocol: Yes   Plan:  Give 2000 units bolus x 1 Start heparin infusion at 1250 units/hr Check anti-Xa level in 6 hours and daily while on heparin Continue to monitor H&H and platelets  Gerre PebblesGarrett Brynna Dobos 11/11/2017,8:46 PM

## 2017-11-11 NOTE — Progress Notes (Signed)
Contacted patients daughter at mobile [phone], gave update per patient request that patient being started on heparin gtt for elevated troponin level.

## 2017-11-11 NOTE — Progress Notes (Signed)
Pt had lingering period of desat to the 70's; pulse ox with good waveform.  Lungs wheezy.  CXR order obtained. Pt put back on bipap and repositioned in bed. Lurene ShadowBAllen, RN

## 2017-11-11 NOTE — Progress Notes (Signed)
Pt developed a nose bleed on the left side; 2x2 gauze used to stop bleeding. She con't to refuse hygeine care.  Chest phsyiotherapy via bed.  Lurene ShadowBAllen, RN

## 2017-11-11 NOTE — Progress Notes (Signed)
[facility] [place] [facility]   PATIENT NAME: Cathy Guzman    MR#:  [id number]  DATE OF BIRTH:  10/17/1945  SUBJECTIVE:  CHIEF COMPLAINT:   Chief Complaint  Patient presents with  . Shortness of Breath   Came with shortness of breath and pulmonary edema, initially was on BiPAP, came off. Feeling better today, had some epistaxis which responded with packing and humidified oxygen.  REVIEW OF SYSTEMS:  CONSTITUTIONAL: No fever, fatigue or weakness.  EYES: No blurred or double vision.  EARS, NOSE, AND THROAT: No tinnitus or ear pain.  RESPIRATORY: No cough, have shortness of breath, wheezing or hemoptysis.  CARDIOVASCULAR: No chest pain, orthopnea, edema.  GASTROINTESTINAL: No nausea, vomiting, diarrhea or abdominal pain.  GENITOURINARY: No dysuria, hematuria.  ENDOCRINE: No polyuria, nocturia,  HEMATOLOGY: No anemia, easy bruising or bleeding SKIN: No rash or lesion. MUSCULOSKELETAL: No joint pain or arthritis.   NEUROLOGIC: No tingling, numbness, weakness.  PSYCHIATRY: No anxiety or depression.   ROS  DRUG ALLERGIES:   Allergies  Allergen Reactions  . Ace Inhibitors     unknown  . Bactrim [Sulfamethoxazole-Trimethoprim] Itching  . Doxycycline Hyclate     unknown  . Hydrocodone     unknown  . Oxycodone     unknown  . Sulfanilamide     unknown    VITALS:  Blood pressure 118/68, pulse 97, temperature 98.3 F (36.8 C), resp. rate 18, height 5\' 8"  (1.727 m), weight 119 kg (262 lb 5.6 oz), SpO2 96 %.  PHYSICAL EXAMINATION:   GENERAL:  72 y.o.-year-old patient lying in the bed with acute distress.  EYES: Pupils equal, round, reactive to light and accommodation. No scleral icterus. Extraocular muscles intact.  HEENT: Head atraumatic, normocephalic. Oropharynx and nasopharynx clear. Nasal packing in place. NECK:  Supple, no jugular venous distention. No thyroid enlargement, no tenderness.  LUNGS: Normal breath sounds bilaterally, bilateral  wheezing, some crepitation. Positive use of accessory muscles of respiration. On nasal cannula oxygen now.Marland Kitchen. CARDIOVASCULAR: S1, S2 normal. No murmurs, rubs, or gallops.  ABDOMEN: Soft, nontender, nondistended. Bowel sounds present. No organomegaly or mass.  EXTREMITIES: No pedal edema, cyanosis, or clubbing.  NEUROLOGIC: Cranial nerves II through XII are intact. Muscle strength 3-4/5 in all extremities. Sensation intact. Gait not checked.  PSYCHIATRIC: The patient is alert and oriented x 3.  SKIN: No obvious rash, lesion, or ulcer.     Physical Exam LABORATORY PANEL:   CBC Recent Labs  Lab 11/11/17 0428  WBC 8.9  HGB 8.8*  HCT 27.0*  PLT 238   ------------------------------------------------------------------------------------------------------------------  Chemistries  Recent Labs  Lab 11/07/17 0816  11/11/17 0428  NA 123*   < > 133*  K 3.6   < > 4.3  CL 82*   < > 90*  CO2 32   < > 30  GLUCOSE 121*   < > 194*  BUN 60*   < > 52*  CREATININE 2.74*   < > 2.30*  CALCIUM 8.6*   < > 9.4  AST 15  --   --   ALT 9*  --   --   ALKPHOS 89  --   --   BILITOT 0.5  --   --    < > = values in this interval not displayed.   ------------------------------------------------------------------------------------------------------------------  Cardiac Enzymes Recent Labs  Lab 11/11/17 0739 11/11/17 1240  TROPONINI 0.08* 0.49*   ------------------------------------------------------------------------------------------------------------------  RADIOLOGY:  Dg Chest 1 View  Result Date: 11/10/2017 CLINICAL DATA:  Shortness of Breath EXAM: CHEST 1 VIEW COMPARISON:  None. FINDINGS: Cardiac shadow is mildly enlarged. Prominent central vascularity is noted with some mild interstitial edema. Bibasilar atelectatic changes are seen. No sizable effusion is noted. No acute bony abnormality is seen. IMPRESSION: Prominent central vascularity with mild interstitial edema. Electronically Signed    By: Alcide Clever M.D.   On: 11/10/2017 09:23   Dg Chest Port 1 View  Result Date: 11/11/2017 CLINICAL DATA:  Congestive heart failure. EXAM: PORTABLE CHEST 1 VIEW COMPARISON:  11/10/2017 FINDINGS: The patient is rotated to the right. The cardiac silhouette is enlarged. Pulmonary vascular congestion and mild interstitial edema have slightly improved. Bibasilar atelectasis is again noted. No large pleural effusion or pneumothorax is identified. IMPRESSION: Slight improvement of mild interstitial edema. Electronically Signed   By: Sebastian Ache M.D.   On: 11/11/2017 06:40    ASSESSMENT AND PLAN:   Principal Problem:   Acute on chronic respiratory failure with hypoxia (HCC) Active Problems:   COPD with acute exacerbation (HCC)   Acute on chronic respiratory failure (HCC)  * Acute on chronic respiratory failure with hypoxia   This is secondary to COPD and CHF   on BiPAP on admission, treat underlying reasons and monitor in stepdown.    now on nasal cannula oxygen, improved.    * Ac COPD exacerbation   IV and inhaled steroids.   Nebulizer treatments.   Currently no fever or sputum production, calcitonin is not high, does not need antibiotic.  * Acute on chronic diastolic congestive heart failure   Her Lasix and metolazone Stopped in last admission   Slight pulmonary edema on x-ray, no edema on legs.  responded to IV Lasix and BiPAP, now on oral Lasix twice a day and nasal cannula oxygen.  * Chronic renal failure stage 4   Monitor in hospital, slightly worse today.   we may need to reevaluate need of Lasix on discharge.  * History of coronary artery disease   Continue aspirin, Plavix, atorvastatin   Hold metoprolol for now his blood pressure is running borderline.  * Hypertension   Hold medications for now as blood pressure is borderline.  * Diabetes   Continue Lantus, keep on sliding scale coverage.    All the records are reviewed and case discussed with Care  Management/Social Workerr. Management plans discussed with the patient, family and they are in agreement.  CODE STATUS: DO NOT RESUSCITATE.  TOTAL TIME TAKING CARE OF THIS PATIENT: 35 minutes.     POSSIBLE D/C IN 1-2 DAYS, DEPENDING ON CLINICAL CONDITION.   Altamese Dilling M.D on 11/11/2017   Between 7am to 6pm - Pager - [phone]  After 6pm go to [email or web address] - password [email or web address]  [facility]  Office  [phone]  CC: Primary care physician; Dorothey Baseman, MD  Note: This dictation was prepared with Dragon dictation along with smaller phrase technology. Any transcriptional errors that result from this process are unintentional.

## 2017-11-11 NOTE — Progress Notes (Signed)
Pt had c/o prior to shift change of "chest pain". But she voices that she knows it's her reflux and after taking Mylanta, the discomfort has subsided. Attempted to use bedpan for bm; no success.  Respositioned in bed and purewik replaced.  Lurene ShadowBAllen, RN

## 2017-11-11 NOTE — Progress Notes (Signed)
PT Cancellation Note  Patient Details Name: Cathy KassCynthia Gelb MRN: [id number] DOB: 09/21/1946   Cancelled Treatment:    Reason Eval/Treat Not Completed: Medical issues which prohibited therapy.  Has some medical symptoms and labs that indicate some distress and will try later as time and pt allow.   Ivar DrapeRuth E Wallace Cogliano 11/11/2017, 3:35 PM   Samul Dadauth Hunt Zajicek, PT MS Acute Rehab Dept. Number: [facility] [phone] and [facility] [phone]

## 2017-11-11 NOTE — Progress Notes (Signed)
PULMONARY / CRITICAL CARE MEDICINE   Name: Cathy Guzman MRN: [id number] DOB: [birth year]    ADMISSION DATE:  11/10/2017  CHIEF COMPLAINT:  Dysnoea  HISTORY OF PRESENT ILLNESS:   43 F with PMH  significant for COPD, CAD, CHF, CKD IV, presented to the ED this morning with 2 days of acutely worsening SOB. Pt was discharged from [facility] on 1/30 after a 5-day admission for weakness and hyponatremia. The patient's hyponatremia was attributed to diuretic usage at that time (she was on both lasix and metolazone). She was subsequently discharged home without diuretics.  Patient was readmitted on 11/10/2017 with complaints of dysnoea.    REVIEW OF SYSTEMS:   Patient reports inability to walk for about 2 weeks.  She reports intermittent dyspepsia.  SUBJECTIVE:  This morning patient complained about dyspepsia again with only temporary relief with Mylanta.  VITAL SIGNS: BP 122/76   Pulse 94   Temp 98.3 F (36.8 C)   Resp 17   Ht 5\' 8"  (1.727 m)   Wt 262 lb 5.6 oz (119 kg)   SpO2 98%   BMI 39.89 kg/m   HEMODYNAMICS:  No compromise  VENTILATOR SETTINGS: FiO2 (%):  [35 %] 35 % BIPAP in the night.  Now on 6 liters [place] Oxygen  INTAKE / OUTPUT: I/O last 3 completed shifts: In: -  Out: 440 [Urine:440]  PHYSICAL EXAMINATION: General:  Obese, not in distress Neuro: Oriented to place and person, Awake, alert and appropriate, lower extremities power 2/5  HEENT:  PERRL Cardiovascular:  Sinus tachycardia Lungs:  Few crackles Abdomen:  Obese, soft, +BS Musculoskeletal: Traceoedema   Skin:  Ecchymosis Left > Right upper extrmiites  LABS:  BMET Recent Labs  Lab 11/07/17 0816 11/08/17 0509 11/10/17 0904 11/11/17 0428  NA 123* 125* 133* 133*  K 3.6  --  4.7 4.3  CL 82*  --  89* 90*  CO2 32  --  32 30  BUN 60*  --  45* 52*  CREATININE 2.74*  --  2.18* 2.30*  GLUCOSE 121*  --  167* 194*    Electrolytes Recent Labs  Lab 11/07/17 0816 11/10/17 0904 11/11/17 0428  CALCIUM 8.6* 9.9 9.4     CBC Recent Labs  Lab 11/07/17 0816 11/10/17 0904 11/11/17 0428  WBC 6.7 11.8* 8.9  HGB 8.6* 9.2* 8.8*  HCT 26.3* 28.5* 27.0*  PLT 223 263 238    Coag's No results for input(s): APTT, INR in the last 168 hours.  Sepsis Markers Recent Labs  Lab 11/10/17 0904  PROCALCITON 0.13    ABG Recent Labs  Lab 11/10/17 0948  PHART 7.36  PCO2ART 60*  PO2ART 62*    Liver Enzymes Recent Labs  Lab 11/07/17 0816  AST 15  ALT 9*  ALKPHOS 89  BILITOT 0.5  ALBUMIN 2.9*    Cardiac Enzymes Recent Labs  Lab 11/10/17 0904 11/11/17 0739  TROPONINI 0.05* 0.08*    Glucose Recent Labs  Lab 11/07/17 2116 11/08/17 0729 11/10/17 1201 11/10/17 1651 11/10/17 2251 11/11/17 0730  GLUCAP 160* 114* 159* 204* 218* 172*    Imaging Dg Chest 1 View  Result Date: 11/10/2017 CLINICAL DATA:  Shortness of Breath EXAM: CHEST 1 VIEW COMPARISON:  None. FINDINGS: Cardiac shadow is mildly enlarged. Prominent central vascularity is noted with some mild interstitial edema. Bibasilar atelectatic changes are seen. No sizable effusion is noted. No acute bony abnormality is seen. IMPRESSION: Prominent central vascularity with mild interstitial edema. Electronically Signed   By: Eulah Pont.D.  On: 11/10/2017 09:23   Dg Chest Port 1 View  Result Date: 11/11/2017 CLINICAL DATA:  Congestive heart failure. EXAM: PORTABLE CHEST 1 VIEW COMPARISON:  11/10/2017 FINDINGS: The patient is rotated to the right. The cardiac silhouette is enlarged. Pulmonary vascular congestion and mild interstitial edema have slightly improved. Bibasilar atelectasis is again noted. No large pleural effusion or pneumothorax is identified. IMPRESSION: Slight improvement of mild interstitial edema. Electronically Signed   By: Sebastian AcheAllen  Grady M.D.   On: 11/11/2017 06:40    ICU day2  ASSESSMENT / PLAN:  PULMONARY A:  Acute on chronic Respiratory Failure secondary to exacerbation to COPD Acute Pulmonary oedema- improving P:    Will continue with bronchodilators, decrease steroids Will continue on daily Lasix whilst monitoring BUN/Creat  CARDIOVASCULAR A:  Pulmonary oedema with negative cardiac enzymes HX Dyspepsia ( does not appear to be cardiac in nature as troponin negative Hx CAD P:  Continue on Lasix Patient given GI cocktail, Protonix Will restart home antacids Will restart beta blocker and Imdur  RENAL A:   Chronic stage 4 Kidney disease- slight bump in bun/creat P:   Will continue to monitor renal function Will decrease steroid therapy  GASTROINTESTINAL A:   Acute on chronic dyspepsia Hx GOERD P:   GI cocktail, restart Mylanta  HEMATOLOGIC A:   Chronic Anemia from Renal disease P:  Will transfuse for Hg< 7  INFECTIOUS A:   No active issues    ENDOCRINE A:   Hx of DM type 2  P:   Continue target Glucose monitoring  NEUROLOGIC A:   Restless leg pain P:   Started on Requip PT consult to evaluate her mobility      Jackson LatinoKarol Naitik Hermann, MD Pulmonary and Critical Care Medicine [facility] Pager: [phone]  11/11/2017, 8:56 AM

## 2017-11-12 DIAGNOSIS — J9621 Acute and chronic respiratory failure with hypoxia: Secondary | ICD-10-CM

## 2017-11-12 LAB — BASIC METABOLIC PANEL
ANION GAP: 12 (ref 5–15)
ANION GAP: 13 (ref 5–15)
BUN: 56 mg/dL — AB (ref 6–20)
BUN: 60 mg/dL — ABNORMAL HIGH (ref 6–20)
CALCIUM: 9.3 mg/dL (ref 8.9–10.3)
CO2: 30 mmol/L (ref 22–32)
CO2: 31 mmol/L (ref 22–32)
Calcium: 9.6 mg/dL (ref 8.9–10.3)
Chloride: 87 mmol/L — ABNORMAL LOW (ref 101–111)
Chloride: 85 mmol/L — ABNORMAL LOW (ref 101–111)
Creatinine, Ser: 2.41 mg/dL — ABNORMAL HIGH (ref 0.44–1.00)
Creatinine, Ser: 2.59 mg/dL — ABNORMAL HIGH (ref 0.44–1.00)
GFR calc Af Amer: 20 mL/min — ABNORMAL LOW (ref >60)
GFR, EST AFRICAN AMERICAN: 22 mL/min — AB (ref >60)
GFR, EST NON AFRICAN AMERICAN: 19 mL/min — AB (ref >60)
GFR, EST NON AFRICAN AMERICAN: 17 mL/min — AB (ref >60)
Glucose, Bld: 207 mg/dL — ABNORMAL HIGH (ref 65–99)
Glucose, Bld: 202 mg/dL — ABNORMAL HIGH (ref 65–99)
POTASSIUM: 4.5 mmol/L (ref 3.5–5.1)
Potassium: 4.7 mmol/L (ref 3.5–5.1)
SODIUM: 129 mmol/L — AB (ref 135–145)
Sodium: 129 mmol/L — ABNORMAL LOW (ref 135–145)

## 2017-11-12 LAB — HEPARIN LEVEL (UNFRACTIONATED)
HEPARIN UNFRACTIONATED: 0.62 [IU]/mL (ref 0.30–0.70)
Heparin Unfractionated: 0.87 IU/mL — ABNORMAL HIGH (ref 0.30–0.70)
Heparin Unfractionated: 0.8 IU/mL — ABNORMAL HIGH (ref 0.30–0.70)

## 2017-11-12 LAB — CBC
HEMATOCRIT: 23.6 % — AB (ref 35.0–47.0)
Hemoglobin: 7.7 g/dL — ABNORMAL LOW (ref 12.0–16.0)
MCH: 29.4 pg (ref 26.0–34.0)
MCHC: 32.7 g/dL (ref 32.0–36.0)
MCV: 89.9 fL (ref 80.0–100.0)
Platelets: 237 10*3/uL (ref 150–440)
RBC: 2.62 MIL/uL — ABNORMAL LOW (ref 3.80–5.20)
RDW: 15.4 % — AB (ref 11.5–14.5)
WBC: 8.4 10*3/uL (ref 3.6–11.0)

## 2017-11-12 LAB — MAGNESIUM: Magnesium: 3.2 mg/dL — ABNORMAL HIGH (ref 1.7–2.4)

## 2017-11-12 LAB — TROPONIN I
TROPONIN I: 3.14 ng/mL — AB (ref <0.03)
Troponin I: 3.07 ng/mL (ref <0.03)

## 2017-11-12 LAB — GLUCOSE, CAPILLARY
Glucose-Capillary: 196 mg/dL — ABNORMAL HIGH (ref 65–99)
Glucose-Capillary: 235 mg/dL — ABNORMAL HIGH (ref 65–99)
Glucose-Capillary: 176 mg/dL — ABNORMAL HIGH (ref 65–99)
Glucose-Capillary: 126 mg/dL — ABNORMAL HIGH (ref 65–99)

## 2017-11-12 LAB — PHOSPHORUS: PHOSPHORUS: 4.3 mg/dL (ref 2.5–4.6)

## 2017-11-12 MED ORDER — ISOSORBIDE MONONITRATE ER 60 MG PO TB24
60.0000 mg | ORAL_TABLET | Freq: Every day | ORAL | Status: DC
  Administered 2017-11-13 – 2017-11-14 (×2): 60 mg via ORAL
  Filled 2017-11-12 (×2): qty 1

## 2017-11-12 MED ORDER — FUROSEMIDE 40 MG PO TABS
40.0000 mg | ORAL_TABLET | Freq: Every day | ORAL | Status: DC
  Administered 2017-11-13 – 2017-11-14 (×2): 40 mg via ORAL
  Filled 2017-11-12 (×2): qty 1

## 2017-11-12 MED ORDER — HEPARIN (PORCINE) IN NACL 100-0.45 UNIT/ML-% IJ SOLN
1000.0000 [IU]/h | INTRAMUSCULAR | Status: DC
  Administered 2017-11-12 – 2017-11-13 (×2): 1000 [IU]/h via INTRAVENOUS
  Filled 2017-11-12 (×2): qty 250

## 2017-11-12 MED ORDER — ISOSORBIDE MONONITRATE ER 30 MG PO TB24
30.0000 mg | ORAL_TABLET | Freq: Once | ORAL | Status: AC
  Administered 2017-11-12: 30 mg via ORAL
  Filled 2017-11-12: qty 1

## 2017-11-12 MED ORDER — INSULIN GLARGINE 100 UNIT/ML ~~LOC~~ SOLN
15.0000 [IU] | Freq: Every day | SUBCUTANEOUS | Status: DC
  Administered 2017-11-12 – 2017-11-13 (×2): 15 [IU] via SUBCUTANEOUS
  Filled 2017-11-12 (×4): qty 0.15

## 2017-11-12 MED ORDER — ALUM & MAG HYDROXIDE-SIMETH 200-200-20 MG/5ML PO SUSP
15.0000 mL | ORAL | Status: DC | PRN
  Administered 2017-11-14: 15 mL via ORAL
  Filled 2017-11-12: qty 30

## 2017-11-12 MED ORDER — PREDNISONE 10 MG PO TABS
10.0000 mg | ORAL_TABLET | Freq: Every day | ORAL | Status: DC
  Administered 2017-11-13 – 2017-11-14 (×2): 10 mg via ORAL
  Filled 2017-11-12 (×2): qty 1

## 2017-11-12 NOTE — Clinical Social Work Note (Signed)
Clinical Social Work Assessment  Patient Details  Name: Cathy Guzman MRN: [id number] Date of Birth: [birth year]  Date of referral:  11/12/17               Reason for consult:  Facility Placement                Permission sought to share information with:  Facility Art therapist granted to share information::  Yes, Verbal Permission Granted  Name::        Agency::     Relationship::     Contact Information:     Housing/Transportation Living arrangements for the past 2 months:  [place] of Information:  Patient, Medical Team, Adult Children, Facility Patient Interpreter Needed:  None Criminal Activity/Legal Involvement Pertinent to Current Situation/Hospitalization:  No - Comment as needed Significant Relationships:  Adult Children, Warehouse manager, [employer] Lives with:  Facility Resident Do you feel safe going back to the place where you live?  Yes Need for family participation in patient care:  No (Coment)  Care giving concerns:  Patient admitted from [facility]   Social Worker assessment / plan:  CSW met with the patient and her daughter at bedside to discuss discharge planning. The patient and her daughter confirmed that the patient is a STR resident at [facility] and is getting close to co-pay days. The patient reported that she does not wish to return to [facility] due to problems with an aide. The patient's daughter indicated that she would like her mother to have placement closer to [facility].  The CSW advised of the barriers to discharge including the time limited PASRR (expires 12/07/2017). The family hopes to apply for LTC Medicaid as they understand that the patient is realistically in need of longer term care and services than STR can provide. The CSW has begun the referral process for STR in the meantime in the [place] area with the addition of [facility]), [facility] in [place]. The  patient and her daughter stated that [facility] is the preference.  The patient will transfer to 2A from CCU today. The discharge plan is unknown at this time. PT will need to evaluate the patient for continued STR. CSW is following and will update the family of bed offers as available.  Employment status:  Retired Forensic scientist:  Medicare PT Recommendations:  Not assessed at this time [place] / Referral to community resources:  [place]  Patient/Family's Response to care:  The patient and her family thanked the CSW.  Patient/Family's Understanding of and Emotional Response to Diagnosis, Current Treatment, and Prognosis:  The patient and her family are in agreement with the discharge plan and are understanding of the long term goals.  Emotional Assessment Appearance:  Appears stated age Attitude/Demeanor/Rapport:  Engaged Affect (typically observed):  Appropriate, Pleasant Orientation:  Oriented to Self, Oriented to Place, Oriented to  Time, Oriented to Situation Alcohol / Substance use:  Never Used Psych involvement (Current and /or in the community):  No (Comment)  Discharge Needs  Concerns to be addressed:  Care Coordination, Discharge Planning Concerns Readmission within the last 30 days:  Yes Current discharge risk:  Chronically ill Barriers to Discharge:  Continued Medical Work up   [employer], LCSW 11/12/2017, 3:42 PM

## 2017-11-12 NOTE — Progress Notes (Signed)
Pt complaining of 8/10 CP. Dr. Peggye Pittichards rounding on pt and aware of CP. Per Dr. Peggye Pittichards will try GI cocktail and see if there is any relief of pain.

## 2017-11-12 NOTE — Evaluation (Signed)
Physical Therapy Evaluation Patient Details Name: Cathy Guzman MRN: [id number] DOB: 02-Aug-1946 Today's Date: 11/12/2017   History of Present Illness  Cathy Guzman  is a 72 y.o. female with a known history of acute myocardial infarction, bipolar disorder, diastolic congestive heart failure, essential hypertension, gout, hyperlipidemia, iron deficiency anemia, COPD on chronic home oxygen use, type 2 diabetes- has repeated admissions in last 1 month- initially at [facility] and last week admitted here with hyponatremia secondary to multiple diuretics use, which improved after holding the diuretics and she was sent to rehabilitation 2 days ago without diuretics prescriptions. She also had acute on chronic renal failure and was advised to follow with nephrology clinic in one week.  Clinical Impression  Pt in bed upon PT arrival.  Pt reported severe pain in BLE and states that the pain in each LE feels different but both are intensified by movement.  She is unable to perform bed mobility without assistance and reports that she has not ambulated in two weeks, but did so with a RW when she did walk.  Pt is on 4 L of O2 and desat to 88% with attempt to bring LE's over EOB.  Pt reported increased pain and requested to return to supine.  Pt demonstrated ability to perform quad sets, partial range hip abduction and ankle pumps in supine.  PT educated pt concerning regularly doing these exercises until she sees PT again and pt expressed understanding.  Pt will continue to benefit from skilled PT with focus on strength, tolerance to activity, functional mobility and pain management.    Follow Up Recommendations SNF    Equipment Recommendations       Recommendations for Other Services       Precautions / Restrictions Precautions Precautions: Fall Restrictions Weight Bearing Restrictions: No      Mobility  Bed Mobility Overal bed mobility: Needs Assistance Bed Mobility: Supine to Sit     Supine to sit: Max  assist     General bed mobility comments: Pt required assistance to bring LE's over EOB but desat to 88% immediately and reported increased pain in LL.  Pt requested to return to supine.  Transfers Overall transfer level: (Did not perform.  Unable to assess.)                  Ambulation/Gait Ambulation/Gait assistance: (Did not perform.  Unable to assess.)              Stairs            Wheelchair Mobility    Modified Rankin (Stroke Patients Only)       Balance                                             Pertinent Vitals/Pain Pain Assessment: 0-10 Pain Score: 7  Pain Location: R LL pain between knee and area of venous stasis appearing bruised, L LL: pt reports feels like deep pain Pain Intervention(s): Limited activity within patient's tolerance    Home Living Family/patient expects to be discharged to:: Skilled nursing facility Living Arrangements: Alone                    Prior Function Level of Independence: Needs assistance   Gait / Transfers Assistance Needed: Pt states that she was able to ambulate with a RW two weeks ago but that she  does not leave her house.  ADL's / Homemaking Assistance Needed: Pt requires assistance from daughter who lives next door.        Hand Dominance        Extremity/Trunk Assessment   Upper Extremity Assessment Upper Extremity Assessment: Generalized weakness    Lower Extremity Assessment Lower Extremity Assessment: Generalized weakness    Cervical / Trunk Assessment Cervical / Trunk Assessment: Kyphotic  Communication   Communication: No difficulties  Cognition Arousal/Alertness: Awake/alert Behavior During Therapy: WFL for tasks assessed/performed Overall Cognitive Status: Within Functional Limits for tasks assessed                                        General Comments      Exercises Low Level/ICU Exercises Ankle Circles/Pumps: 10  reps;Both;Supine Quad Sets: Strengthening;Both;10 reps;Supine Hip ABduction/ADduction: Strengthening;Both;10 reps;Supine   Assessment/Plan    PT Assessment Patient needs continued PT services  PT Problem List Decreased strength;Decreased mobility;Decreased activity tolerance;Decreased balance;Decreased knowledge of use of DME       PT Treatment Interventions DME instruction;Therapeutic activities;Gait training;Therapeutic exercise;Balance training;Functional mobility training;Patient/family education    PT Goals (Current goals can be found in the Care Plan section)  Acute Rehab PT Goals Patient Stated Goal: To regain as much strength as possible PT Goal Formulation: With patient Time For Goal Achievement: 11/26/17 Potential to Achieve Goals: Fair    Frequency Min 2X/week   Barriers to discharge Decreased caregiver support      Co-evaluation               AM-PAC PT "6 Clicks" Daily Activity  Outcome Measure Difficulty turning over in bed (including adjusting bedclothes, sheets and blankets)?: A Lot Difficulty moving from lying on back to sitting on the side of the bed? : Unable Difficulty sitting down on and standing up from a chair with arms (e.g., wheelchair, bedside commode, etc,.)?: Unable Help needed moving to and from a bed to chair (including a wheelchair)?: Total Help needed walking in hospital room?: Total Help needed climbing 3-5 steps with a railing? : Total 6 Click Score: 7    End of Session Equipment Utilized During Treatment: Oxygen Activity Tolerance: Treatment limited secondary to medical complications (Comment) Patient left: in bed;with call bell/phone within reach;with bed alarm set Nurse Communication: Mobility status PT Visit Diagnosis: Muscle weakness (generalized) (M62.81);Pain Pain - Right/Left: Right(Bilateral) Pain - part of body: Leg    Time: 1505-1530 PT Time Calculation (min) (ACUTE ONLY): 25 min   Charges:   PT Evaluation $PT Eval  Moderate Complexity: 1 Mod PT Treatments $Therapeutic Exercise: 8-22 mins   PT G Codes:   PT G-Codes **NOT FOR INPATIENT CLASS** Functional Assessment Tool Used: AM-PAC 6 Clicks Basic Mobility    Glenetta HewSarah Tracia Lacomb, PT, DPT   Glenetta HewSarah Scarleth Brame 11/12/2017, 3:44 PM

## 2017-11-12 NOTE — Progress Notes (Signed)
Patient transferred from ccu to unit. Report received from Maralyn SagoSarah, RN. Pt Oriented to room, call bell, and staff. Bed in lowest position. . Telemetry box verification with tele clerk- Box#: [id number]. Will continue to monitor.

## 2017-11-12 NOTE — Progress Notes (Signed)
Pt has coughed up a small amount of blood-tinged sputum x 2 today. In addition she tells me she is hallucinating. She states she is seeing people in the room, but when she actually opens her eyes there is no one there. Dr. Peggye Pittichards notified of the above. No new orders received.

## 2017-11-12 NOTE — Progress Notes (Addendum)
Pt transferring to 2A today. Pt is aware and agreeable to transfer. Assessment is unchanged from this morning and receiving RN has been given report. Belongings sent with pt at bedside.Pt placed on telemetry box [id number] and central telemetry notified of pt transfer.

## 2017-11-12 NOTE — Progress Notes (Signed)
ANTICOAGULATION CONSULT NOTE - Follow up Consult  Pharmacy Consult for heparin gtt Indication: chest pain/ACS  Allergies  Allergen Reactions  . Ace Inhibitors     unknown  . Bactrim [Sulfamethoxazole-Trimethoprim] Itching  . Doxycycline Hyclate     unknown  . Hydrocodone     unknown  . Oxycodone     unknown  . Sulfanilamide     unknown    Patient Measurements: Height: 5\' 8"  (172.7 cm) Weight: 262 lb 5.6 oz (119 kg) IBW/kg (Calculated) : 63.9 Heparin Dosing Weight: 91.6kg  Vital Signs: Temp: 97.5 F (36.4 C) (02/03 0806) Temp Source: Axillary (02/03 0806) BP: 154/89 (02/03 1100) Pulse Rate: 104 (02/03 1100)  Labs: Recent Labs    11/11/17 0428  11/11/17 1843 11/11/17 2146 11/12/17 0241 11/12/17 0608 11/12/17 1059  HGB 8.8*  --   --  8.6*  --  7.7*  --   HCT 27.0*  --   --  26.0*  --  23.6*  --   PLT 238  --   --  309  --  237  --   APTT  --   --   --  153*  --   --   --   LABPROT  --   --   --  16.6*  --   --   --   INR  --   --   --  1.35  --   --   --   HEPARINUNFRC  --   --   --   --  0.87*  --  0.80*  CREATININE 2.30*  --   --  2.41*  --  2.59*  --   TROPONINI  --    < > 3.07* 3.07*  --  3.14*  --    < > = values in this interval not displayed.    Estimated Creatinine Clearance: 26.2 mL/min (A) (by C-G formula based on SCr of 2.59 mg/dL (H)).   Medical History: Past Medical History:  Diagnosis Date  . Acute myocardial infarction, subendocardial infarction, initial episode of care (HCC)   . Anginal syndrome (HCC)   . Atopic rhinitis   . Avitaminosis D   . Bacteremia   . Better eye: severe vision impairment; lesser eye: blind   . Bipolar disorder, unspecified (HCC)   . Cannot walk   . CHF (congestive heart failure) (HCC)   . CN (constipation)   . Coronary atherosclerosis of native coronary artery   . Encounter for vocational therapy   . Esophageal reflux   . Essential hypertension, malignant   . Gout, unspecified   . Heart failure, diastolic  (HCC)   . Hyperlipemia   . Hypomagnesemia   . Hypopotassemia   . Iron deficiency anemia, unspecified   . Muscle weakness (generalized)   . Obstructive chronic bronchitis without exacerbation (HCC)   . Oropharyngeal dysphagia   . Persistent disorder of initiating or maintaining sleep   . Rash and other nonspecific skin eruption   . Scar painful   . Scissor gait   . Shortness of breath   . Spasm of muscle   . Type II diabetes mellitus with renal manifestations (HCC)   . Urinary tract infection, site not specified     Medications:  Medications Prior to Admission  Medication Sig Dispense Refill Last Dose  . albuterol (PROVENTIL HFA;VENTOLIN HFA) 108 (90 Base) MCG/ACT inhaler Inhale 2 puffs into the lungs every 3 (three) hours as needed for wheezing or shortness of breath.  prn at prn  . allopurinol (ZYLOPRIM) 100 MG tablet Take 100 mg by mouth daily.   11/10/2017 at 0800  . alum & mag hydroxide-simeth (MAALOX/MYLANTA) 200-200-20 MG/5ML suspension Take by mouth every 4 (four) hours as needed for indigestion or heartburn.   prn at prn  . amLODipine (NORVASC) 10 MG tablet Take 10 mg by mouth daily.   11/10/2017 at 0800  . ARIPiprazole (ABILIFY) 20 MG tablet Take 20 mg by mouth daily.   11/10/2017 at 0800  . aspirin EC 81 MG tablet Take 81 mg by mouth daily.   11/10/2017 at 0800  . atorvastatin (LIPITOR) 80 MG tablet Take 80 mg by mouth daily.   11/10/2017 at 0800  . clopidogrel (PLAVIX) 75 MG tablet Take 75 mg by mouth daily.   11/10/2017 at 0800  . diclofenac sodium (VOLTAREN) 1 % GEL Apply 2 g topically 4 (four) times daily.   11/09/2017 at 1700  . dorzolamide (TRUSOPT) 2 % ophthalmic solution Place 1 drop into the right eye 2 (two) times daily.   11/09/2017 at 2100  . ferrous sulfate 325 (65 FE) MG EC tablet Take 325 mg by mouth daily with breakfast.   11/10/2017 at 0800  . fluticasone (FLONASE) 50 MCG/ACT nasal spray Place 1 spray into both nostrils daily.   11/10/2017 at 0800  . Fluticasone-Salmeterol  (ADVAIR) 500-50 MCG/DOSE AEPB Inhale 1 puff into the lungs 2 (two) times daily.   11/10/2017 at 0800  . insulin glargine (LANTUS) 100 UNIT/ML injection Inject 0.1 mLs (10 Units total) into the skin at bedtime. 10 mL 11 11/09/2017 at 2100  . isosorbide mononitrate (IMDUR) 30 MG 24 hr tablet Take 90 mg by mouth daily.   11/10/2017 at 0800  . lamoTRIgine (LAMICTAL) 100 MG tablet Take 150 mg by mouth daily.   11/10/2017 at 0800  . Melatonin 10 MG TABS Take 10 mg by mouth at bedtime as needed (for sleeping).   prn at prn  . metoprolol tartrate (LOPRESSOR) 50 MG tablet Take 100 mg by mouth 2 (two) times daily.   11/10/2017 at 0800  . Omega 3 1000 MG CAPS Take 1,000 mg by mouth every evening.   11/09/2017 at 1800  . omega-3 acid ethyl esters (LOVAZA) 1 g capsule Take 1 g by mouth daily.   11/09/2017 at 1800  . omeprazole (PRILOSEC) 20 MG capsule Take 40 mg by mouth 2 (two) times daily before a meal.   11/10/2017 at 0800  . polyethylene glycol (MIRALAX / GLYCOLAX) packet Take 17 g by mouth daily.   11/09/2017 at 1800  . QUEtiapine (SEROQUEL) 25 MG tablet Take 25 mg by mouth 2 (two) times daily.   11/10/2017 at 0600  . QUEtiapine (SEROQUEL) 50 MG tablet Take 50 mg by mouth at bedtime.   11/09/2017 at 2000  . senna (SENOKOT) 8.6 MG TABS tablet Take 2 tablets by mouth at bedtime as needed for mild constipation.   prn at prn  . sertraline (ZOLOFT) 100 MG tablet Take 200 mg by mouth at bedtime.   11/09/2017 at 2000  . sucralfate (CARAFATE) 1 g tablet Take 1 g by mouth 4 (four) times daily.   11/10/2017 at 0800  . tiotropium (SPIRIVA) 18 MCG inhalation capsule Place 18 mcg into inhaler and inhale daily.   11/09/2017 at 0800   Scheduled:  . allopurinol  100 mg Oral Daily  . ARIPiprazole  20 mg Oral Daily  . aspirin EC  81 mg Oral Daily  . atorvastatin  80  mg Oral Daily  . budesonide (PULMICORT) nebulizer solution  0.25 mg Nebulization BID  . chlorhexidine  15 mL Mouth Rinse BID  . clopidogrel  75 mg Oral Daily  . diclofenac  sodium  2 g Topical QID  . dorzolamide  1 drop Right Eye BID  . ferrous sulfate  325 mg Oral Q breakfast  . fluticasone  1 spray Each Nare Daily  . furosemide  40 mg Oral BID  . insulin aspart  0-9 Units Subcutaneous TID WC  . insulin glargine  10 Units Subcutaneous QHS  . ipratropium-albuterol  3 mL Nebulization Q4H  . isosorbide mononitrate  30 mg Oral Once  . [START ON 11/13/2017] isosorbide mononitrate  60 mg Oral Daily  . lamoTRIgine  150 mg Oral Daily  . mouth rinse  15 mL Mouth Rinse q12n4p  . methylPREDNISolone (SOLU-MEDROL) injection  40 mg Intravenous Q6H  . metoprolol tartrate  12.5 mg Oral BID  . omega-3 acid ethyl esters  1,000 mg Oral QPM  . pantoprazole  40 mg Oral BID AC  . polyethylene glycol  17 g Oral Daily  . QUEtiapine  25 mg Oral BID  . sertraline  200 mg Oral QHS  . sucralfate  1 g Oral TID AC & HS  . tiotropium  18 mcg Inhalation Daily   Infusions:  . heparin     PRN: acetaminophen, alum & mag hydroxide-simeth, diphenhydrAMINE, docusate sodium, Melatonin, nitroGLYCERIN, senna Anti-infectives (From admission, onward)   None      Assessment: 72 year old female who just got heparin 5000units subq, started on heparin gtt for ACS/Chest pain with rising troponins. Will give half bolus and start on usual heparin rate per protocol    Goal of Therapy:  Heparin level 0.3-0.7 units/ml Monitor platelets by anticoagulation protocol: Yes   Plan:  Give 2000 units bolus x 1 Start heparin infusion at 1250 units/hr Check anti-Xa level in 6 hours and daily while on heparin Continue to monitor H&H and platelets   02/03 @ 0300 HL 0.87 supratherapeutic. Will decrease rate to heparin 1100 units/hr, patient not bleeding per RN, will recheck HL @ 1100. Will check CBC w/ am labs.    2/3 ~ 11:00  HL = 0.8.  Decreased drip rate to 1000 units/hr.  Will recheck HL tonight at 19:00.   Stormy Card, [facility] Clinical Pharmacist 11/12/2017, 11:54 AM

## 2017-11-12 NOTE — Progress Notes (Signed)
Pt's troponin came back elevated at 3.14 this AM. Dr. Peggye Pittichards notified, no new orders received.

## 2017-11-12 NOTE — Progress Notes (Signed)
PULMONARY / CRITICAL CARE MEDICINE   Name: Cathy Guzman MRN: [id number] DOB: 1946/01/17    ADMISSION DATE:  11/10/2017  CHIEF COMPLAINT:  Dysnoea  HISTORY OF PRESENT ILLNESS:   59 F with PMH  significant for COPD, CAD, CHF, CKD IV, presented to the ED this morning with 2 days of acutely worsening SOB. Pt was discharged from [facility] on 1/30 after a 5-day admission for weakness and hyponatremia. The patient's hyponatremia was attributed to diuretic usage at that time (she was on both lasix and metolazone). She was subsequently discharged home without diuretics.  Patient was readmitted on 11/10/2017 with complaints of dysnoea.    REVIEW OF SYSTEMS:   Patient complained of chest pain yesterday evening and again this AM  SUBJECTIVE:  This morning patient complained about dyspepsia again with only temporary relief with Mylanta.  VITAL SIGNS: BP (!) 154/89   Pulse (!) 104   Temp (!) 97.5 F (36.4 C) (Axillary)   Resp 16   Ht 5\' 8"  (1.727 m)   Wt 262 lb 5.6 oz (119 kg)   SpO2 92%   BMI 39.89 kg/m   HEMODYNAMICS:  No compromise  VENTILATOR SETTINGS: FiO2 (%):  [40 %] 40 % BIPAP in the night.  Now on 6 liters [place] Oxygen  INTAKE / OUTPUT: I/O last 3 completed shifts: In: 628.7 [P.O.:520; I.V.:108.7] Out: 990 [Urine:990]  PHYSICAL EXAMINATION: General:  Obese, not in distress Neuro: Oriented to place and person, Awake, alert and appropriate, lower extremities power 2/5, vision loss left eye  HEENT:  PERRL Cardiovascular:  Sinus tachycardia Lungs:  Few crackles Abdomen:  Obese, soft, +BS Musculoskeletal: Traceoedema   Skin:  Ecchymosis Left > Right upper extrmiites  LABS:  BMET Recent Labs  Lab 11/11/17 0428 11/11/17 2146 11/12/17 0608  NA 133* 129* 129*  K 4.3 4.5 4.7  CL 90* 87* 85*  CO2 30 30 31   BUN 52* 56* 60*  CREATININE 2.30* 2.41* 2.59*  GLUCOSE 194* 207* 202*    Electrolytes Recent Labs  Lab 11/11/17 0428 11/11/17 2146 11/12/17 0608  CALCIUM 9.4 9.6 9.3   MG  --   --  3.2*  PHOS  --   --  4.3    CBC Recent Labs  Lab 11/11/17 0428 11/11/17 2146 11/12/17 0608  WBC 8.9 11.1* 8.4  HGB 8.8* 8.6* 7.7*  HCT 27.0* 26.0* 23.6*  PLT 238 309 237    Coag's Recent Labs  Lab 11/11/17 2146  APTT 153*  INR 1.35    Sepsis Markers Recent Labs  Lab 11/10/17 0904  PROCALCITON 0.13    ABG Recent Labs  Lab 11/10/17 0948  PHART 7.36  PCO2ART 60*  PO2ART 62*    Liver Enzymes Recent Labs  Lab 11/07/17 0816  AST 15  ALT 9*  ALKPHOS 89  BILITOT 0.5  ALBUMIN 2.9*    Cardiac Enzymes Recent Labs  Lab 11/11/17 1843 11/11/17 2146 11/12/17 0608  TROPONINI 3.07* 3.07* 3.14*    Glucose Recent Labs  Lab 11/11/17 0730 11/11/17 1127 11/11/17 1635 11/11/17 2149 11/12/17 0736 11/12/17 1240  GLUCAP 172* 233* 220* 177* 196* 235*    Imaging Dg Chest Port 1 View  Result Date: 11/11/2017 CLINICAL DATA:  Shortness of breath, pulmonary edema, respiratory distress. EXAM: PORTABLE CHEST 1 VIEW COMPARISON:  11/11/2017. FINDINGS: Cardiomegaly. Mild vascular congestion. Small BILATERAL effusions. No definite consolidation or edema. No pneumothorax both change from priors when technique differences are considered. IMPRESSION: Stable chest. Electronically Signed   By: Jackquline Denmark  Curnes M.D.   On: 11/11/2017 17:31    ICU day2  ASSESSMENT / PLAN:  PULMONARY A:  Acute on chronic Respiratory Failure secondary to exacerbation to COPD- much improved.  Has been off BIPAP throughout the day. Acute Pulmonary oedema- resolved P:   Will continue with bronchodilators, change Solumedrol to Prednisone Will decrease Lasix to daily as Sodium is trending downward. May need to change Diuretic to Diamox.  CARDIOVASCULAR A:  NSTEMI vs Demand ischemia Pulmonary oedema has resolved HX Dyspepsia Hx CAD P:  Continue on Heparin drip for another 24 hours, Seen By Cardiologist- conservative management; monitor hemoglobin. Lasix dose decreased to daily.   If Na+ decreases more may need to use Diamox as diuretic rather than Lasix. Continue on GI cocktail- PPI, mylanta Continue  beta blocker;  Imdur dose increased today  RENAL A:   Chronic stage 4 Kidney disease- again  bump in bun/creat P:   Lasix dose decreased to daily D/C Solumedrol and start low dose Prednisone  GASTROINTESTINAL A:   Acute on chronic dyspepsia Hx GOERD P:   GI cocktail, on Mylanta/ Maylox  HEMATOLOGIC A:   Chronic Anemia from Renal disease- no active blood loss noted P:  Will transfuse for Hg< 7  INFECTIOUS A:   No active issues    ENDOCRINE A:   Hx of DM type 2- uncontrolled  P:   Continue target Glucose monitoring, steroid dose decreased.  NEUROLOGIC A:   Restless leg pain P:   Started on Requip PT consult to evaluate her mobility  Family- daughter updated  Disp: May transfer to telemetry    Cathy LatinoKarol Josilynn Losh, MD Pulmonary and Critical Care Medicine [facility] Pager: [phone]  11/12/2017, 2:04 PM

## 2017-11-12 NOTE — [place] FL2 (Signed)
[place] MEDICAID FL2 LEVEL OF CARE SCREENING TOOL     IDENTIFICATION  Patient Name: Cathy Guzman Birthdate: 1946/05/13 Sex: female Admission Date (Current Location): 11/10/2017  [place] and [place] Number:  Chiropodist and Address:  [facility], [address] [place], [place] [address]      Provider Number: [id number]  Attending Physician Name and Address:  Altamese Dilling, *  Relative Name and Phone Number:  Marilou Barnfield (Daughter/HCPOA) [phone]    Current Level of Care: Hospital Recommended Level of Care: Skilled Nursing Facility Prior Approval Number:    Date Approved/Denied:   PASRR Number: [id number] (Exp 12/07/2017)  Discharge Plan: SNF    Current Diagnoses: Patient Active Problem List   Diagnosis Date Noted  . Acute on chronic respiratory failure with hypoxia (HCC) 11/10/2017  . COPD with acute exacerbation (HCC) 11/10/2017  . Acute on chronic respiratory failure (HCC) 11/10/2017  . Hyponatremia 11/03/2017  . Diabetes (HCC) 11/03/2017  . COPD (chronic obstructive pulmonary disease) (HCC) 11/03/2017  . CAD (coronary artery disease) 11/03/2017  . HLD (hyperlipidemia) 11/03/2017  . Chronic diastolic CHF (congestive heart failure) (HCC) 11/03/2017  . GERD (gastroesophageal reflux disease) 11/03/2017  . HTN (hypertension) 11/03/2017    Orientation RESPIRATION BLADDER Height & Weight     Self, Time, Situation, Place  O2(5L o2) Continent Weight: 262 lb 5.6 oz (119 kg) Height:  5\' 8"  (172.7 cm)  BEHAVIORAL SYMPTOMS/MOOD NEUROLOGICAL BOWEL NUTRITION STATUS      Continent Diet(Dysphagia 1)  AMBULATORY STATUS COMMUNICATION OF NEEDS Skin   Extensive Assist Verbally Bruising                       Personal Care Assistance Level of Assistance  Bathing, Feeding, Dressing Bathing Assistance: Limited assistance Feeding assistance: Independent Dressing Assistance: Limited assistance     Functional Limitations  Info  Sight Sight Info: Impaired(Left eye impaired)        SPECIAL CARE FACTORS FREQUENCY  PT (By licensed PT)     PT Frequency: Up to 5X per day, 5 days per week              Contractures Contractures Info: Not present    Additional Factors Info  Code Status, Allergies Code Status Info: DNR Allergies Info: Ace Inhibitors, Bactrim Sulfamethoxazole-trimethoprim, Doxycycline Hyclate, Hydrocodone, Oxycodone, Sulfanilamide Psychotropic Info: Abilify, Seroquel, Zoloft, Lamictal Insulin Sliding Scale Info: Novolog: 0-9 TID with meals       Current Medications (11/12/2017):  This is the current hospital active medication list Current Facility-Administered Medications  Medication Dose Route Frequency Provider Last Rate Last Dose  . acetaminophen (TYLENOL) tablet 650 mg  650 mg Oral Q6H PRN Annett Fabian, MD   650 mg at 11/12/17 0959  . allopurinol (ZYLOPRIM) tablet 100 mg  100 mg Oral Daily Altamese Dilling, MD   100 mg at 11/12/17 0927  . alum & mag hydroxide-simeth (MAALOX/MYLANTA) 200-200-20 MG/5ML suspension 15 mL  15 mL Oral Q4H PRN Annett Fabian, MD      . ARIPiprazole (ABILIFY) tablet 20 mg  20 mg Oral Daily Altamese Dilling, MD   20 mg at 11/12/17 0925  . aspirin EC tablet 81 mg  81 mg Oral Daily Altamese Dilling, MD   81 mg at 11/12/17 0925  . atorvastatin (LIPITOR) tablet 80 mg  80 mg Oral Daily Altamese Dilling, MD   80 mg at 11/12/17 0925  . budesonide (PULMICORT) nebulizer solution 0.25 mg  0.25 mg Nebulization BID  Altamese Dilling, MD   0.25 mg at 11/12/17 0839  . chlorhexidine (PERIDEX) 0.12 % solution 15 mL  15 mL Mouth Rinse BID Erin Fulling, MD   15 mL at 11/12/17 0927  . clopidogrel (PLAVIX) tablet 75 mg  75 mg Oral Daily Altamese Dilling, MD   75 mg at 11/12/17 0926  . diclofenac sodium (VOLTAREN) 1 % transdermal gel 2 g  2 g Topical QID Altamese Dilling, MD   2 g at 11/12/17 1438  . diphenhydrAMINE (BENADRYL)  injection 12.5 mg  12.5 mg Intravenous Once PRN Erin Fulling, MD      . docusate sodium (COLACE) capsule 100 mg  100 mg Oral BID PRN Altamese Dilling, MD      . dorzolamide (TRUSOPT) 2 % ophthalmic solution 1 drop  1 drop Right Eye BID Altamese Dilling, MD   1 drop at 11/12/17 0927  . ferrous sulfate tablet 325 mg  325 mg Oral Q breakfast Altamese Dilling, MD   325 mg at 11/12/17 0924  . fluticasone (FLONASE) 50 MCG/ACT nasal spray 1 spray  1 spray Each Nare Daily Altamese Dilling, MD      . Melene Muller ON 11/13/2017] furosemide (LASIX) tablet 40 mg  40 mg Oral Daily Jackson Latino A, MD      . heparin ADULT infusion 100 units/mL (25000 units/279mL sodium chloride 0.45%)  1,000 Units/hr Intravenous Continuous Marylou Flesher S, NP 10 mL/hr at 11/12/17 1250 1,000 Units/hr at 11/12/17 1250  . insulin aspart (novoLOG) injection 0-9 Units  0-9 Units Subcutaneous TID WC Altamese Dilling, MD   3 Units at 11/12/17 1247  . insulin glargine (LANTUS) injection 15 Units  15 Units Subcutaneous QHS Jackson Latino A, MD      . ipratropium-albuterol (DUONEB) 0.5-2.5 (3) MG/3ML nebulizer solution 3 mL  3 mL Nebulization Q4H Altamese Dilling, MD   3 mL at 11/12/17 1254  . [START ON 11/13/2017] isosorbide mononitrate (IMDUR) 24 hr tablet 60 mg  60 mg Oral Daily Paraschos, Alexander, MD      . lamoTRIgine (LAMICTAL) tablet 150 mg  150 mg Oral Daily Altamese Dilling, MD   150 mg at 11/12/17 0926  . MEDLINE mouth rinse  15 mL Mouth Rinse q12n4p Erin Fulling, MD   15 mL at 11/10/17 1723  . Melatonin TABS 10 mg  10 mg Oral QHS PRN Altamese Dilling, MD   10 mg at 11/11/17 2211  . metoprolol tartrate (LOPRESSOR) tablet 12.5 mg  12.5 mg Oral BID Annett Fabian, MD   12.5 mg at 11/12/17 1610  . nitroGLYCERIN (NITROSTAT) SL tablet 0.4 mg  0.4 mg Sublingual Q5 min PRN Marylou Flesher S, NP      . omega-3 acid ethyl esters (LOVAZA) capsule 1,000 mg  1,000 mg Oral QPM Altamese Dilling, MD   1,000 mg at 11/10/17 1720  . pantoprazole (PROTONIX) EC tablet 40 mg  40 mg Oral BID AC Altamese Dilling, MD   40 mg at 11/12/17 0924  . polyethylene glycol (MIRALAX / GLYCOLAX) packet 17 g  17 g Oral Daily Altamese Dilling, MD   17 g at 11/12/17 0923  . [START ON 11/13/2017] predniSONE (DELTASONE) tablet 10 mg  10 mg Oral Q breakfast Annett Fabian, MD      . QUEtiapine (SEROQUEL) tablet 25 mg  25 mg Oral BID Altamese Dilling, MD   25 mg at 11/12/17 0924  . senna (SENOKOT) tablet 17.2 mg  2 tablet Oral QHS PRN Altamese Dilling, MD      .  sertraline (ZOLOFT) tablet 200 mg  200 mg Oral QHS Altamese DillingVachhani, Vaibhavkumar, MD   200 mg at 11/11/17 2152  . sucralfate (CARAFATE) tablet 1 g  1 g Oral TID AC & HS Altamese DillingVachhani, Vaibhavkumar, MD   1 g at 11/12/17 1248  . tiotropium (SPIRIVA) inhalation capsule 18 mcg  18 mcg Inhalation Daily Altamese DillingVachhani, Vaibhavkumar, MD   18 mcg at 11/12/17 [id number]     Discharge Medications: Please see discharge summary for a list of discharge medications.  Relevant Imaging Results:  Relevant Lab Results:   Additional Information SS #[id number]  Judi CongKaren M Lakashia Collison, LCSW

## 2017-11-12 NOTE — Progress Notes (Signed)
Pt is on continuous IV heparin to treat elevated troponins. IV team consulted to attempt additional IV access as pt has IV steroids ordered. IV team came to assess pt. Pt was very tender to touch on bilateral arms with multiple ecchymosis present. Per IV RN request, MD consulted to see if pt's IV steroids could be changed to PO so that pt does not have to endure additional IV placement at this time. MD stated she was agreeable to changing steroids to PO. Will hold off on additional IV placement at this time for pt comfort.

## 2017-11-12 NOTE — Progress Notes (Signed)
ANTICOAGULATION CONSULT NOTE - Initial Consult  Pharmacy Consult for heparin gtt Indication: chest pain/ACS  Allergies  Allergen Reactions  . Ace Inhibitors     unknown  . Bactrim [Sulfamethoxazole-Trimethoprim] Itching  . Doxycycline Hyclate     unknown  . Hydrocodone     unknown  . Oxycodone     unknown  . Sulfanilamide     unknown    Patient Measurements: Height: 5\' 8"  (172.7 cm) Weight: 262 lb 5.6 oz (119 kg) IBW/kg (Calculated) : 63.9 Heparin Dosing Weight: 91.6kg  Vital Signs: Temp: 97.2 F (36.2 C) (02/03 0155) Temp Source: Axillary (02/03 0155) BP: 96/57 (02/03 0400) Pulse Rate: 61 (02/03 0400)  Labs: Recent Labs    11/10/17 0904 11/11/17 0428  11/11/17 1240 11/11/17 1843 11/11/17 2146 11/12/17 0241  HGB 9.2* 8.8*  --   --   --  8.6*  --   HCT 28.5* 27.0*  --   --   --  26.0*  --   PLT 263 238  --   --   --  309  --   APTT  --   --   --   --   --  153*  --   LABPROT  --   --   --   --   --  16.6*  --   INR  --   --   --   --   --  1.35  --   HEPARINUNFRC  --   --   --   --   --   --  0.87*  CREATININE 2.18* 2.30*  --   --   --  2.41*  --   TROPONINI 0.05*  --    < > 0.49* 3.07* 3.07*  --    < > = values in this interval not displayed.    Estimated Creatinine Clearance: 28.2 mL/min (A) (by C-G formula based on SCr of 2.41 mg/dL (H)).   Medical History: Past Medical History:  Diagnosis Date  . Acute myocardial infarction, subendocardial infarction, initial episode of care (HCC)   . Anginal syndrome (HCC)   . Atopic rhinitis   . Avitaminosis D   . Bacteremia   . Better eye: severe vision impairment; lesser eye: blind   . Bipolar disorder, unspecified (HCC)   . Cannot walk   . CHF (congestive heart failure) (HCC)   . CN (constipation)   . Coronary atherosclerosis of native coronary artery   . Encounter for vocational therapy   . Esophageal reflux   . Essential hypertension, malignant   . Gout, unspecified   . Heart failure, diastolic  (HCC)   . Hyperlipemia   . Hypomagnesemia   . Hypopotassemia   . Iron deficiency anemia, unspecified   . Muscle weakness (generalized)   . Obstructive chronic bronchitis without exacerbation (HCC)   . Oropharyngeal dysphagia   . Persistent disorder of initiating or maintaining sleep   . Rash and other nonspecific skin eruption   . Scar painful   . Scissor gait   . Shortness of breath   . Spasm of muscle   . Type II diabetes mellitus with renal manifestations (HCC)   . Urinary tract infection, site not specified     Medications:  Medications Prior to Admission  Medication Sig Dispense Refill Last Dose  . albuterol (PROVENTIL HFA;VENTOLIN HFA) 108 (90 Base) MCG/ACT inhaler Inhale 2 puffs into the lungs every 3 (three) hours as needed for wheezing or shortness of breath.  prn at prn  . allopurinol (ZYLOPRIM) 100 MG tablet Take 100 mg by mouth daily.   11/10/2017 at 0800  . alum & mag hydroxide-simeth (MAALOX/MYLANTA) 200-200-20 MG/5ML suspension Take by mouth every 4 (four) hours as needed for indigestion or heartburn.   prn at prn  . amLODipine (NORVASC) 10 MG tablet Take 10 mg by mouth daily.   11/10/2017 at 0800  . ARIPiprazole (ABILIFY) 20 MG tablet Take 20 mg by mouth daily.   11/10/2017 at 0800  . aspirin EC 81 MG tablet Take 81 mg by mouth daily.   11/10/2017 at 0800  . atorvastatin (LIPITOR) 80 MG tablet Take 80 mg by mouth daily.   11/10/2017 at 0800  . clopidogrel (PLAVIX) 75 MG tablet Take 75 mg by mouth daily.   11/10/2017 at 0800  . diclofenac sodium (VOLTAREN) 1 % GEL Apply 2 g topically 4 (four) times daily.   11/09/2017 at 1700  . dorzolamide (TRUSOPT) 2 % ophthalmic solution Place 1 drop into the right eye 2 (two) times daily.   11/09/2017 at 2100  . ferrous sulfate 325 (65 FE) MG EC tablet Take 325 mg by mouth daily with breakfast.   11/10/2017 at 0800  . fluticasone (FLONASE) 50 MCG/ACT nasal spray Place 1 spray into both nostrils daily.   11/10/2017 at 0800  . Fluticasone-Salmeterol  (ADVAIR) 500-50 MCG/DOSE AEPB Inhale 1 puff into the lungs 2 (two) times daily.   11/10/2017 at 0800  . insulin glargine (LANTUS) 100 UNIT/ML injection Inject 0.1 mLs (10 Units total) into the skin at bedtime. 10 mL 11 11/09/2017 at 2100  . isosorbide mononitrate (IMDUR) 30 MG 24 hr tablet Take 90 mg by mouth daily.   11/10/2017 at 0800  . lamoTRIgine (LAMICTAL) 100 MG tablet Take 150 mg by mouth daily.   11/10/2017 at 0800  . Melatonin 10 MG TABS Take 10 mg by mouth at bedtime as needed (for sleeping).   prn at prn  . metoprolol tartrate (LOPRESSOR) 50 MG tablet Take 100 mg by mouth 2 (two) times daily.   11/10/2017 at 0800  . Omega 3 1000 MG CAPS Take 1,000 mg by mouth every evening.   11/09/2017 at 1800  . omega-3 acid ethyl esters (LOVAZA) 1 g capsule Take 1 g by mouth daily.   11/09/2017 at 1800  . omeprazole (PRILOSEC) 20 MG capsule Take 40 mg by mouth 2 (two) times daily before a meal.   11/10/2017 at 0800  . polyethylene glycol (MIRALAX / GLYCOLAX) packet Take 17 g by mouth daily.   11/09/2017 at 1800  . QUEtiapine (SEROQUEL) 25 MG tablet Take 25 mg by mouth 2 (two) times daily.   11/10/2017 at 0600  . QUEtiapine (SEROQUEL) 50 MG tablet Take 50 mg by mouth at bedtime.   11/09/2017 at 2000  . senna (SENOKOT) 8.6 MG TABS tablet Take 2 tablets by mouth at bedtime as needed for mild constipation.   prn at prn  . sertraline (ZOLOFT) 100 MG tablet Take 200 mg by mouth at bedtime.   11/09/2017 at 2000  . sucralfate (CARAFATE) 1 g tablet Take 1 g by mouth 4 (four) times daily.   11/10/2017 at 0800  . tiotropium (SPIRIVA) 18 MCG inhalation capsule Place 18 mcg into inhaler and inhale daily.   11/09/2017 at 0800   Scheduled:  . allopurinol  100 mg Oral Daily  . ARIPiprazole  20 mg Oral Daily  . aspirin EC  81 mg Oral Daily  . atorvastatin  80  mg Oral Daily  . budesonide (PULMICORT) nebulizer solution  0.25 mg Nebulization BID  . chlorhexidine  15 mL Mouth Rinse BID  . clopidogrel  75 mg Oral Daily  . diclofenac  sodium  2 g Topical QID  . dorzolamide  1 drop Right Eye BID  . ferrous sulfate  325 mg Oral Q breakfast  . fluticasone  1 spray Each Nare Daily  . furosemide  40 mg Oral BID  . insulin aspart  0-9 Units Subcutaneous TID WC  . insulin glargine  10 Units Subcutaneous QHS  . ipratropium-albuterol  3 mL Nebulization Q4H  . isosorbide mononitrate  30 mg Oral Daily  . lamoTRIgine  150 mg Oral Daily  . mouth rinse  15 mL Mouth Rinse q12n4p  . methylPREDNISolone (SOLU-MEDROL) injection  40 mg Intravenous Q6H  . metoprolol tartrate  12.5 mg Oral BID  . omega-3 acid ethyl esters  1,000 mg Oral QPM  . pantoprazole  40 mg Oral BID AC  . polyethylene glycol  17 g Oral Daily  . QUEtiapine  25 mg Oral BID  . sertraline  200 mg Oral QHS  . sucralfate  1 g Oral TID AC & HS  . tiotropium  18 mcg Inhalation Daily   Infusions:  . heparin 1,250 Units/hr (11/11/17 2111)   PRN: acetaminophen, alum & mag hydroxide-simeth, diphenhydrAMINE, docusate sodium, Melatonin, nitroGLYCERIN, senna Anti-infectives (From admission, onward)   None      Assessment: 72 year old female who just got heparin 5000units subq, started on heparin gtt for ACS/Chest pain with rising troponins. Will give half bolus and start on usual heparin rate per protocol    Goal of Therapy:  Heparin level 0.3-0.7 units/ml Monitor platelets by anticoagulation protocol: Yes   Plan:  Give 2000 units bolus x 1 Start heparin infusion at 1250 units/hr Check anti-Xa level in 6 hours and daily while on heparin Continue to monitor H&H and platelets   02/03 @ 0300 HL 0.87 supratherapeutic. Will decrease rate to heparin 1100 units/hr, patient not bleeding per RN, will recheck HL @ 1100. Will check CBC w/ am labs.  Thomasene Ripple, PharmD, BCPS Clinical Pharmacist 11/12/2017

## 2017-11-12 NOTE — Consult Note (Signed)
[facility] Cardiology  CARDIOLOGY CONSULT NOTE  Patient ID: Alsha Meland MRN: [id number] DOB/AGE: [birth year] 72 y.o.  Admit date: 11/10/2017 Referring Physician vacchhani Primary Physician [facility] Primary Cardiologist  Reason for Consultation non-STEMI  HPI: 72 year old female referred for evaluation of possible non-STEMI.  Patient has a history of prior myocardial infarction, chronic diastolic congestive heart failure, O2 dependent COPD, morbid obesity, and stage IV chronic kidney disease.  She has had recent multiple admissions for hyponatremia, acute on chronic diastolic congestive heart failure, COPD exacerbations, currently resides at [facility].  She presents to [facility] emergency room, with recent history of increasing shortness of breath and tachypnea, noted to be hypoxic, diagnosed with COPD exacerbation and acute on chronic diastolic congestive heart failure.  ECG is nondiagnostic, revealing normal sinus rhythm with right bundle branch block.  Initial troponin was 0.49.  Follow-up troponins 3.07, 3.07, and 3.14.  Admission labs also notable for elevated BUN and creatinine of 60 and 2.59.  Review of systems complete and found to be negative unless listed above     Past Medical History:  Diagnosis Date  . Acute myocardial infarction, subendocardial infarction, initial episode of care (HCC)   . Anginal syndrome (HCC)   . Atopic rhinitis   . Avitaminosis D   . Bacteremia   . Better eye: severe vision impairment; lesser eye: blind   . Bipolar disorder, unspecified (HCC)   . Cannot walk   . CHF (congestive heart failure) (HCC)   . CN (constipation)   . Coronary atherosclerosis of native coronary artery   . Encounter for vocational therapy   . Esophageal reflux   . Essential hypertension, malignant   . Gout, unspecified   . Heart failure, diastolic (HCC)   . Hyperlipemia   . Hypomagnesemia   . Hypopotassemia   . Iron deficiency anemia, unspecified   . Muscle weakness (generalized)    . Obstructive chronic bronchitis without exacerbation (HCC)   . Oropharyngeal dysphagia   . Persistent disorder of initiating or maintaining sleep   . Rash and other nonspecific skin eruption   . Scar painful   . Scissor gait   . Shortness of breath   . Spasm of muscle   . Type II diabetes mellitus with renal manifestations (HCC)   . Urinary tract infection, site not specified     Past Surgical History:  Procedure Laterality Date  . ABDOMINAL HYSTERECTOMY    . ABDOMINAL SURGERY    . CHOLECYSTECTOMY    . GASTRIC BYPASS      Medications Prior to Admission  Medication Sig Dispense Refill Last Dose  . albuterol (PROVENTIL HFA;VENTOLIN HFA) 108 (90 Base) MCG/ACT inhaler Inhale 2 puffs into the lungs every 3 (three) hours as needed for wheezing or shortness of breath.   prn at prn  . allopurinol (ZYLOPRIM) 100 MG tablet Take 100 mg by mouth daily.   11/10/2017 at 0800  . alum & mag hydroxide-simeth (MAALOX/MYLANTA) 200-200-20 MG/5ML suspension Take by mouth every 4 (four) hours as needed for indigestion or heartburn.   prn at prn  . amLODipine (NORVASC) 10 MG tablet Take 10 mg by mouth daily.   11/10/2017 at 0800  . ARIPiprazole (ABILIFY) 20 MG tablet Take 20 mg by mouth daily.   11/10/2017 at 0800  . aspirin EC 81 MG tablet Take 81 mg by mouth daily.   11/10/2017 at 0800  . atorvastatin (LIPITOR) 80 MG tablet Take 80 mg by mouth daily.   11/10/2017 at 0800  . clopidogrel (PLAVIX) 75  MG tablet Take 75 mg by mouth daily.   11/10/2017 at 0800  . diclofenac sodium (VOLTAREN) 1 % GEL Apply 2 g topically 4 (four) times daily.   11/09/2017 at 1700  . dorzolamide (TRUSOPT) 2 % ophthalmic solution Place 1 drop into the right eye 2 (two) times daily.   11/09/2017 at 2100  . ferrous sulfate 325 (65 FE) MG EC tablet Take 325 mg by mouth daily with breakfast.   11/10/2017 at 0800  . fluticasone (FLONASE) 50 MCG/ACT nasal spray Place 1 spray into both nostrils daily.   11/10/2017 at 0800  . Fluticasone-Salmeterol  (ADVAIR) 500-50 MCG/DOSE AEPB Inhale 1 puff into the lungs 2 (two) times daily.   11/10/2017 at 0800  . insulin glargine (LANTUS) 100 UNIT/ML injection Inject 0.1 mLs (10 Units total) into the skin at bedtime. 10 mL 11 11/09/2017 at 2100  . isosorbide mononitrate (IMDUR) 30 MG 24 hr tablet Take 90 mg by mouth daily.   11/10/2017 at 0800  . lamoTRIgine (LAMICTAL) 100 MG tablet Take 150 mg by mouth daily.   11/10/2017 at 0800  . Melatonin 10 MG TABS Take 10 mg by mouth at bedtime as needed (for sleeping).   prn at prn  . metoprolol tartrate (LOPRESSOR) 50 MG tablet Take 100 mg by mouth 2 (two) times daily.   11/10/2017 at 0800  . Omega 3 1000 MG CAPS Take 1,000 mg by mouth every evening.   11/09/2017 at 1800  . omega-3 acid ethyl esters (LOVAZA) 1 g capsule Take 1 g by mouth daily.   11/09/2017 at 1800  . omeprazole (PRILOSEC) 20 MG capsule Take 40 mg by mouth 2 (two) times daily before a meal.   11/10/2017 at 0800  . polyethylene glycol (MIRALAX / GLYCOLAX) packet Take 17 g by mouth daily.   11/09/2017 at 1800  . QUEtiapine (SEROQUEL) 25 MG tablet Take 25 mg by mouth 2 (two) times daily.   11/10/2017 at 0600  . QUEtiapine (SEROQUEL) 50 MG tablet Take 50 mg by mouth at bedtime.   11/09/2017 at 2000  . senna (SENOKOT) 8.6 MG TABS tablet Take 2 tablets by mouth at bedtime as needed for mild constipation.   prn at prn  . sertraline (ZOLOFT) 100 MG tablet Take 200 mg by mouth at bedtime.   11/09/2017 at 2000  . sucralfate (CARAFATE) 1 g tablet Take 1 g by mouth 4 (four) times daily.   11/10/2017 at 0800  . tiotropium (SPIRIVA) 18 MCG inhalation capsule Place 18 mcg into inhaler and inhale daily.   11/09/2017 at 0800   Social History   Socioeconomic History  . Marital status: Divorced    Spouse name: Not on file  . Number of children: Not on file  . Years of education: Not on file  . Highest education level: Not on file  Social Needs  . Financial resource strain: Not on file  . Food insecurity - worry: Not on file   . Food insecurity - inability: Not on file  . Transportation needs - medical: Not on file  . Transportation needs - non-medical: Not on file  Occupational History  . Not on file  Tobacco Use  . Smoking status: Never Smoker  . Smokeless tobacco: Never Used  Substance and Sexual Activity  . Alcohol use: No    Frequency: Never  . Drug use: No  . Sexual activity: Not on file  Other Topics Concern  . Not on file  Social History Narrative  . Not  on file    Family History  Problem Relation Age of Onset  . CAD Mother   . Glaucoma Mother   . COPD Father   . COPD Brother   . Parkinsonism Brother       Review of systems complete and found to be negative unless listed above      PHYSICAL EXAM  General: Well developed, well nourished, in no acute distress HEENT:  Normocephalic and atramatic Neck:  No JVD.  Lungs: Clear bilaterally to auscultation and percussion. Heart: HRRR . Normal S1 and S2 without gallops or murmurs.  Abdomen: Bowel sounds are positive, abdomen soft and non-tender  Msk:  Back normal, normal gait. Normal strength and tone for age. Extremities: No clubbing, cyanosis or edema.   Neuro: Alert and oriented X 3. Psych:  Good affect, responds appropriately  Labs:   Lab Results  Component Value Date   WBC 8.4 11/12/2017   HGB 7.7 (L) 11/12/2017   HCT 23.6 (L) 11/12/2017   MCV 89.9 11/12/2017   PLT 237 11/12/2017    Recent Labs  Lab 11/07/17 0816  11/12/17 0608  NA 123*   < > 129*  K 3.6   < > 4.7  CL 82*   < > 85*  CO2 32   < > 31  BUN 60*   < > 60*  CREATININE 2.74*   < > 2.59*  CALCIUM 8.6*   < > 9.3  PROT 5.9*  --   --   BILITOT 0.5  --   --   ALKPHOS 89  --   --   ALT 9*  --   --   AST 15  --   --   GLUCOSE 121*   < > 202*   < > = values in this interval not displayed.   Lab Results  Component Value Date   TROPONINI 3.14 (HH) 11/12/2017   No results found for: CHOL No results found for: HDL No results found for: LDLCALC No results  found for: TRIG No results found for: CHOLHDL No results found for: LDLDIRECT    Radiology: Dg Chest 1 View  Result Date: 11/10/2017 CLINICAL DATA:  Shortness of Breath EXAM: CHEST 1 VIEW COMPARISON:  None. FINDINGS: Cardiac shadow is mildly enlarged. Prominent central vascularity is noted with some mild interstitial edema. Bibasilar atelectatic changes are seen. No sizable effusion is noted. No acute bony abnormality is seen. IMPRESSION: Prominent central vascularity with mild interstitial edema. Electronically Signed   By: Alcide Clever M.D.   On: 11/10/2017 09:23   Dg Abd 1 View  Result Date: 11/05/2017 CLINICAL DATA:  72 year old female with a history of abdominal pain and nausea EXAM: ABDOMEN - 1 VIEW COMPARISON:  None. FINDINGS: Portions of the abdomen have been excluded from the exam. Gas present within stomach, small bowel, colon. No abnormally distended small bowel or colon. No significant formed stool burden. No unexpected soft tissue density.  No unexpected calcifications. Vascular calcifications of the mesenteric vasculature, lower aorta and iliac arteries. Surgical clips within the right abdomen and pelvis, and the epigastric region. No displaced fracture. IMPRESSION: Nonobstructive bowel gas pattern. Atherosclerosis. Electronically Signed   By: Gilmer Mor D.O.   On: 11/05/2017 11:47   US Renal  Result Date: 11/06/2017 CLINICAL DATA:  Acute renal failure. EXAM: RENAL / URINARY TRACT ULTRASOUND COMPLETE COMPARISON:  None. FINDINGS: Right Kidney: Length: 10.1 cm. 2.8 cm simple cyst is noted in midpole. Increased echogenicity of renal parenchyma is noted. No  mass or hydronephrosis visualized. Left Kidney: Length: 10.5 cm. Increased echogenicity of renal parenchyma is noted. No mass or hydronephrosis visualized. Bladder: Appears normal for degree of bladder distention. IMPRESSION: Increased echogenicity of renal parenchyma is noted bilaterally suggesting medical renal disease. No  hydronephrosis or renal obstruction is noted. Electronically Signed   By: Lupita Raider, M.D.   On: 11/06/2017 09:20   Dg Chest Port 1 View  Result Date: 11/11/2017 CLINICAL DATA:  Shortness of breath, pulmonary edema, respiratory distress. EXAM: PORTABLE CHEST 1 VIEW COMPARISON:  11/11/2017. FINDINGS: Cardiomegaly. Mild vascular congestion. Small BILATERAL effusions. No definite consolidation or edema. No pneumothorax both change from priors when technique differences are considered. IMPRESSION: Stable chest. Electronically Signed   By: Elsie Stain M.D.   On: 11/11/2017 17:31   Dg Chest Port 1 View  Result Date: 11/11/2017 CLINICAL DATA:  Congestive heart failure. EXAM: PORTABLE CHEST 1 VIEW COMPARISON:  11/10/2017 FINDINGS: The patient is rotated to the right. The cardiac silhouette is enlarged. Pulmonary vascular congestion and mild interstitial edema have slightly improved. Bibasilar atelectasis is again noted. No large pleural effusion or pneumothorax is identified. IMPRESSION: Slight improvement of mild interstitial edema. Electronically Signed   By: Sebastian Ache M.D.   On: 11/11/2017 06:40    EKG: Normal sinus rhythm, right bundle branch block  ASSESSMENT AND PLAN:   1.  Elevated troponin, in the setting of COPD exacerbation and acute on chronic diastolic congestive heart failure, possible non-STEMI versus demand supply ischemia, and patient with chronic O2 dependent COPD, morbid obesity, with very poor generalized health, with stage IV chronic kidney disease, at extremely high risk for renal failure with contrast nephrotoxicity. 2.  COPD exacerbation 3.  Acute on chronic diastolic congestive heart failure 4.  Stage IV chronic kidney disease  Recommendations  1.  Agree with current therapy 2.  Continue heparin drip 48-72 hours 3.  Uptitrate isosorbide mononitrate to 60 mg daily 4.  Defer cardiac catheterization in light of patient's extensive comorbidities and poor generalized  health.  Patient agrees to pursue conservative management.  Signed: Marcina Millard MD,PhD, [facility] 11/12/2017, 10:37 AM

## 2017-11-12 NOTE — Plan of Care (Signed)
Pt is alert and oriented to self, place, and time. Pt was able to tolerate 5 LPM via  for most of the shift. RT placed patient back on bipap this morning. C/o left leg pain improved with PRN tylenol. Dr. Peggye Pittichards notified of critical troponin of 3.07, new orders entered for heparin drip. Called patients daughter per pt request to give an update on heparin drip, daughter verbalized understanding. Heparin drip initiated per order at 12.5 ml/hr. Rate/dose reduced to 11 ml/hr  this morning per pharmacy dosing. Complete bath and linen change this morning. Pt tolerated well. Vital signs stable. Pt voids incontinent and is unable to ambulate per report, purewick and canister changed this shift.

## 2017-11-12 NOTE — Progress Notes (Signed)
[facility] [facility]   PATIENT NAME: Cathy Guzman    MR#:  [id number]  DATE OF BIRTH:  29-May-1946  SUBJECTIVE:  CHIEF COMPLAINT:   Chief Complaint  Patient presents with  . Shortness of Breath   Came with shortness of breath and pulmonary edema, initially was on BiPAP, came off. Feeling better today, had some chest pain and noted elevated troponin, on heparin IV drip.  REVIEW OF SYSTEMS:  CONSTITUTIONAL: No fever, fatigue or weakness.  EYES: No blurred or double vision.  EARS, NOSE, AND THROAT: No tinnitus or ear pain.  RESPIRATORY: No cough, have shortness of breath, wheezing or hemoptysis.  CARDIOVASCULAR: No chest pain, orthopnea, edema.  GASTROINTESTINAL: No nausea, vomiting, diarrhea or abdominal pain.  GENITOURINARY: No dysuria, hematuria.  ENDOCRINE: No polyuria, nocturia,  HEMATOLOGY: No anemia, easy bruising or bleeding SKIN: No rash or lesion. MUSCULOSKELETAL: No joint pain or arthritis.   NEUROLOGIC: No tingling, numbness, weakness.  PSYCHIATRY: No anxiety or depression.   ROS  DRUG ALLERGIES:   Allergies  Allergen Reactions  . Ace Inhibitors     unknown  . Bactrim [Sulfamethoxazole-Trimethoprim] Itching  . Doxycycline Hyclate     unknown  . Hydrocodone     unknown  . Oxycodone     unknown  . Sulfanilamide     unknown    VITALS:  Blood pressure (!) 87/52, pulse 80, temperature (!) 97.5 F (36.4 C), temperature source Axillary, resp. rate 13, height 5\' 8"  (1.727 m), weight 119 kg (262 lb 5.6 oz), SpO2 99 %.  PHYSICAL EXAMINATION:   GENERAL:  72 y.o.-year-old patient lying in the bed with acute distress.  EYES: Pupils equal, round, reactive to light and accommodation. No scleral icterus. Extraocular muscles intact.  HEENT: Head atraumatic, normocephalic. Oropharynx and nasopharynx clear. Nasal packing in place. NECK:  Supple, no jugular venous distention. No thyroid enlargement, no tenderness.  LUNGS: Normal breath  sounds bilaterally, bilateral wheezing, some crepitation. Positive use of accessory muscles of respiration. On nasal cannula oxygen now.Marland Kitchen CARDIOVASCULAR: S1, S2 normal. No murmurs, rubs, or gallops.  ABDOMEN: Soft, nontender, nondistended. Bowel sounds present. No organomegaly or mass.  EXTREMITIES: No pedal edema, cyanosis, or clubbing.  NEUROLOGIC: Cranial nerves II through XII are intact. Muscle strength 3-4/5 in all extremities. Sensation intact. Gait not checked.  PSYCHIATRIC: The patient is alert and oriented x 3.  SKIN: No obvious rash, lesion, or ulcer.     Physical Exam LABORATORY PANEL:   CBC Recent Labs  Lab 11/12/17 0608  WBC 8.4  HGB 7.7*  HCT 23.6*  PLT 237   ------------------------------------------------------------------------------------------------------------------  Chemistries  Recent Labs  Lab 11/07/17 0816  11/12/17 0608  NA 123*   < > 129*  K 3.6   < > 4.7  CL 82*   < > 85*  CO2 32   < > 31  GLUCOSE 121*   < > 202*  BUN 60*   < > 60*  CREATININE 2.74*   < > 2.59*  CALCIUM 8.6*   < > 9.3  MG  --   --  3.2*  AST 15  --   --   ALT 9*  --   --   ALKPHOS 89  --   --   BILITOT 0.5  --   --    < > = values in this interval not displayed.   ------------------------------------------------------------------------------------------------------------------  Cardiac Enzymes Recent Labs  Lab 11/11/17 2146 11/12/17 0608  TROPONINI 3.07* 3.14*   ------------------------------------------------------------------------------------------------------------------  RADIOLOGY:  Dg Chest Port 1 View  Result Date: 11/11/2017 CLINICAL DATA:  Shortness of breath, pulmonary edema, respiratory distress. EXAM: PORTABLE CHEST 1 VIEW COMPARISON:  11/11/2017. FINDINGS: Cardiomegaly. Mild vascular congestion. Small BILATERAL effusions. No definite consolidation or edema. No pneumothorax both change from priors when technique differences are considered. IMPRESSION:  Stable chest. Electronically Signed   By: Elsie StainJohn T Curnes M.D.   On: 11/11/2017 17:31   Dg Chest Port 1 View  Result Date: 11/11/2017 CLINICAL DATA:  Congestive heart failure. EXAM: PORTABLE CHEST 1 VIEW COMPARISON:  11/10/2017 FINDINGS: The patient is rotated to the right. The cardiac silhouette is enlarged. Pulmonary vascular congestion and mild interstitial edema have slightly improved. Bibasilar atelectasis is again noted. No large pleural effusion or pneumothorax is identified. IMPRESSION: Slight improvement of mild interstitial edema. Electronically Signed   By: Sebastian AcheAllen  Grady M.D.   On: 11/11/2017 06:40    ASSESSMENT AND PLAN:   Principal Problem:   Acute on chronic respiratory failure with hypoxia (HCC) Active Problems:   COPD with acute exacerbation (HCC)   Acute on chronic respiratory failure (HCC)  * Acute on chronic respiratory failure with hypoxia   This is secondary to COPD and CHF   on BiPAP on admission, treat underlying reasons and monitor in stepdown.    now on nasal cannula oxygen, improved.    * Non-ST elevation MI   Heparin IV drip, cardiology consult.  * Ac COPD exacerbation   IV and inhaled steroids.   Nebulizer treatments.   Currently no fever or sputum production, calcitonin is not high, does not need antibiotic.  * Acute on chronic diastolic congestive heart failure   Her Lasix and metolazone Stopped in last admission   Slight pulmonary edema on x-ray, no edema on legs.  responded to IV Lasix and BiPAP, now on oral Lasix twice a day and nasal cannula oxygen.  * Chronic renal failure stage 4   Monitor in hospital,    we may need to reevaluate need of Lasix on discharge.   May not be a good candidate for cardiac catheterization due to this.  * History of coronary artery disease   Continue aspirin, Plavix, atorvastatin    metoprolol started with small dose..  * Hypertension    resume isosorbide.   Small dose metoprolol.  * Diabetes   Continue  Lantus, keep on sliding scale coverage.    All the records are reviewed and case discussed with Care Management/Social Workerr. Management plans discussed with the patient, family and they are in agreement.  CODE STATUS: DO NOT RESUSCITATE.  TOTAL TIME TAKING CARE OF THIS PATIENT: 35 minutes.   Her daughter was present in the room during my visit today. POSSIBLE D/C IN 1-2 DAYS, DEPENDING ON CLINICAL CONDITION.   Altamese DillingVaibhavkumar Roddie Riegler M.D on 11/12/2017   Between 7am to 6pm - Pager - [phone]  After 6pm go to [email or web address] - password [email or web address]  [facility] [facility]  Office  [phone]  CC: Primary care physician; Dorothey BasemanBronstein, David, MD  Note: This dictation was prepared with Dragon dictation along with smaller phrase technology. Any transcriptional errors that result from this process are unintentional.

## 2017-11-12 NOTE — Progress Notes (Signed)
ANTICOAGULATION CONSULT NOTE - Follow up Consult  Pharmacy Consult for heparin gtt Indication: chest pain/ACS  Allergies  Allergen Reactions  . Ace Inhibitors     unknown  . Bactrim [Sulfamethoxazole-Trimethoprim] Itching  . Doxycycline Hyclate     unknown  . Hydrocodone     unknown  . Oxycodone     unknown  . Sulfanilamide     unknown    Patient Measurements: Height: 5\' 8"  (172.7 cm) Weight: 257 lb 15 oz (117 kg) IBW/kg (Calculated) : 63.9 Heparin Dosing Weight: 91.6kg  Vital Signs: Temp: 97.8 F (36.6 C) (02/03 1549) Temp Source: Oral (02/03 1549) BP: 125/64 (02/03 1549) Pulse Rate: 89 (02/03 1549)  Labs: Recent Labs    11/11/17 0428  11/11/17 1843 11/11/17 2146 11/12/17 0241 11/12/17 0608 11/12/17 1059 11/12/17 1851  HGB 8.8*  --   --  8.6*  --  7.7*  --   --   HCT 27.0*  --   --  26.0*  --  23.6*  --   --   PLT 238  --   --  309  --  237  --   --   APTT  --   --   --  153*  --   --   --   --   LABPROT  --   --   --  16.6*  --   --   --   --   INR  --   --   --  1.35  --   --   --   --   HEPARINUNFRC  --   --   --   --  0.87*  --  0.80* 0.62  CREATININE 2.30*  --   --  2.41*  --  2.59*  --   --   TROPONINI  --    < > 3.07* 3.07*  --  3.14*  --   --    < > = values in this interval not displayed.    Estimated Creatinine Clearance: 26 mL/min (A) (by C-G formula based on SCr of 2.59 mg/dL (H)).   Medical History: Past Medical History:  Diagnosis Date  . Acute myocardial infarction, subendocardial infarction, initial episode of care (HCC)   . Anginal syndrome (HCC)   . Atopic rhinitis   . Avitaminosis D   . Bacteremia   . Better eye: severe vision impairment; lesser eye: blind   . Bipolar disorder, unspecified (HCC)   . Cannot walk   . CHF (congestive heart failure) (HCC)   . CN (constipation)   . Coronary atherosclerosis of native coronary artery   . Encounter for vocational therapy   . Esophageal reflux   . Essential hypertension, malignant    . Gout, unspecified   . Heart failure, diastolic (HCC)   . Hyperlipemia   . Hypomagnesemia   . Hypopotassemia   . Iron deficiency anemia, unspecified   . Muscle weakness (generalized)   . Obstructive chronic bronchitis without exacerbation (HCC)   . Oropharyngeal dysphagia   . Persistent disorder of initiating or maintaining sleep   . Rash and other nonspecific skin eruption   . Scar painful   . Scissor gait   . Shortness of breath   . Spasm of muscle   . Type II diabetes mellitus with renal manifestations (HCC)   . Urinary tract infection, site not specified     Medications:  Medications Prior to Admission  Medication Sig Dispense Refill Last Dose  . albuterol (  PROVENTIL HFA;VENTOLIN HFA) 108 (90 Base) MCG/ACT inhaler Inhale 2 puffs into the lungs every 3 (three) hours as needed for wheezing or shortness of breath.   prn at prn  . allopurinol (ZYLOPRIM) 100 MG tablet Take 100 mg by mouth daily.   11/10/2017 at 0800  . alum & mag hydroxide-simeth (MAALOX/MYLANTA) 200-200-20 MG/5ML suspension Take by mouth every 4 (four) hours as needed for indigestion or heartburn.   prn at prn  . amLODipine (NORVASC) 10 MG tablet Take 10 mg by mouth daily.   11/10/2017 at 0800  . ARIPiprazole (ABILIFY) 20 MG tablet Take 20 mg by mouth daily.   11/10/2017 at 0800  . aspirin EC 81 MG tablet Take 81 mg by mouth daily.   11/10/2017 at 0800  . atorvastatin (LIPITOR) 80 MG tablet Take 80 mg by mouth daily.   11/10/2017 at 0800  . clopidogrel (PLAVIX) 75 MG tablet Take 75 mg by mouth daily.   11/10/2017 at 0800  . diclofenac sodium (VOLTAREN) 1 % GEL Apply 2 g topically 4 (four) times daily.   11/09/2017 at 1700  . dorzolamide (TRUSOPT) 2 % ophthalmic solution Place 1 drop into the right eye 2 (two) times daily.   11/09/2017 at 2100  . ferrous sulfate 325 (65 FE) MG EC tablet Take 325 mg by mouth daily with breakfast.   11/10/2017 at 0800  . fluticasone (FLONASE) 50 MCG/ACT nasal spray Place 1 spray into both nostrils  daily.   11/10/2017 at 0800  . Fluticasone-Salmeterol (ADVAIR) 500-50 MCG/DOSE AEPB Inhale 1 puff into the lungs 2 (two) times daily.   11/10/2017 at 0800  . insulin glargine (LANTUS) 100 UNIT/ML injection Inject 0.1 mLs (10 Units total) into the skin at bedtime. 10 mL 11 11/09/2017 at 2100  . isosorbide mononitrate (IMDUR) 30 MG 24 hr tablet Take 90 mg by mouth daily.   11/10/2017 at 0800  . lamoTRIgine (LAMICTAL) 100 MG tablet Take 150 mg by mouth daily.   11/10/2017 at 0800  . Melatonin 10 MG TABS Take 10 mg by mouth at bedtime as needed (for sleeping).   prn at prn  . metoprolol tartrate (LOPRESSOR) 50 MG tablet Take 100 mg by mouth 2 (two) times daily.   11/10/2017 at 0800  . Omega 3 1000 MG CAPS Take 1,000 mg by mouth every evening.   11/09/2017 at 1800  . omega-3 acid ethyl esters (LOVAZA) 1 g capsule Take 1 g by mouth daily.   11/09/2017 at 1800  . omeprazole (PRILOSEC) 20 MG capsule Take 40 mg by mouth 2 (two) times daily before a meal.   11/10/2017 at 0800  . polyethylene glycol (MIRALAX / GLYCOLAX) packet Take 17 g by mouth daily.   11/09/2017 at 1800  . QUEtiapine (SEROQUEL) 25 MG tablet Take 25 mg by mouth 2 (two) times daily.   11/10/2017 at 0600  . QUEtiapine (SEROQUEL) 50 MG tablet Take 50 mg by mouth at bedtime.   11/09/2017 at 2000  . senna (SENOKOT) 8.6 MG TABS tablet Take 2 tablets by mouth at bedtime as needed for mild constipation.   prn at prn  . sertraline (ZOLOFT) 100 MG tablet Take 200 mg by mouth at bedtime.   11/09/2017 at 2000  . sucralfate (CARAFATE) 1 g tablet Take 1 g by mouth 4 (four) times daily.   11/10/2017 at 0800  . tiotropium (SPIRIVA) 18 MCG inhalation capsule Place 18 mcg into inhaler and inhale daily.   11/09/2017 at 0800   Scheduled:  .  allopurinol  100 mg Oral Daily  . ARIPiprazole  20 mg Oral Daily  . aspirin EC  81 mg Oral Daily  . atorvastatin  80 mg Oral Daily  . budesonide (PULMICORT) nebulizer solution  0.25 mg Nebulization BID  . chlorhexidine  15 mL Mouth Rinse  BID  . clopidogrel  75 mg Oral Daily  . diclofenac sodium  2 g Topical QID  . dorzolamide  1 drop Right Eye BID  . ferrous sulfate  325 mg Oral Q breakfast  . fluticasone  1 spray Each Nare Daily  . [START ON 11/13/2017] furosemide  40 mg Oral Daily  . insulin aspart  0-9 Units Subcutaneous TID WC  . insulin glargine  15 Units Subcutaneous QHS  . ipratropium-albuterol  3 mL Nebulization Q4H  . [START ON 11/13/2017] isosorbide mononitrate  60 mg Oral Daily  . lamoTRIgine  150 mg Oral Daily  . mouth rinse  15 mL Mouth Rinse q12n4p  . metoprolol tartrate  12.5 mg Oral BID  . omega-3 acid ethyl esters  1,000 mg Oral QPM  . pantoprazole  40 mg Oral BID AC  . polyethylene glycol  17 g Oral Daily  . [START ON 11/13/2017] predniSONE  10 mg Oral Q breakfast  . QUEtiapine  25 mg Oral BID  . sertraline  200 mg Oral QHS  . sucralfate  1 g Oral TID AC & HS  . tiotropium  18 mcg Inhalation Daily   Infusions:  . heparin 1,000 Units/hr (11/12/17 1730)   PRN: acetaminophen, alum & mag hydroxide-simeth, diphenhydrAMINE, docusate sodium, Melatonin, nitroGLYCERIN, senna Anti-infectives (From admission, onward)   None      Assessment: 72 year old female who just got heparin 5000units subq, started on heparin gtt for ACS/Chest pain with rising troponins. Will give half bolus and start on usual heparin rate per protocol    Goal of Therapy:  Heparin level 0.3-0.7 units/ml Monitor platelets by anticoagulation protocol: Yes   Plan:  Give 2000 units bolus x 1 Start heparin infusion at 1250 units/hr Check anti-Xa level in 6 hours and daily while on heparin Continue to monitor H&H and platelets   02/03 @ 0300 HL 0.87 supratherapeutic. Will decrease rate to heparin 1100 units/hr, patient not bleeding per RN, will recheck HL @ 1100. Will check CBC w/ am labs.    2/3 ~ 11:00  HL = 0.8.  Decreased drip rate to 1000 units/hr.  Will recheck HL tonight at 19:00.   11/12/2017 18:51 HL therapeutic x 1.  Continue current rate. Will recheck HL in 8 hours.  Carola FrostNathan A Thadeus Gandolfi, [facility] IncRPH Clinical Pharmacist 11/12/2017, 11:54 AM

## 2017-11-13 DIAGNOSIS — Z515 Encounter for palliative care: Secondary | ICD-10-CM

## 2017-11-13 DIAGNOSIS — N184 Chronic kidney disease, stage 4 (severe): Secondary | ICD-10-CM

## 2017-11-13 DIAGNOSIS — I509 Heart failure, unspecified: Secondary | ICD-10-CM

## 2017-11-13 DIAGNOSIS — Z66 Do not resuscitate: Secondary | ICD-10-CM

## 2017-11-13 LAB — RENAL FUNCTION PANEL
Albumin: 2.9 g/dL — ABNORMAL LOW (ref 3.5–5.0)
Anion gap: 14 (ref 5–15)
BUN: 61 mg/dL — ABNORMAL HIGH (ref 6–20)
CALCIUM: 9.6 mg/dL (ref 8.9–10.3)
CHLORIDE: 88 mmol/L — AB (ref 101–111)
CO2: 32 mmol/L (ref 22–32)
CREATININE: 2.48 mg/dL — AB (ref 0.44–1.00)
GFR, EST AFRICAN AMERICAN: 21 mL/min — AB (ref >60)
GFR, EST NON AFRICAN AMERICAN: 18 mL/min — AB (ref >60)
Glucose, Bld: 119 mg/dL — ABNORMAL HIGH (ref 65–99)
Phosphorus: 3.2 mg/dL (ref 2.5–4.6)
Potassium: 4.1 mmol/L (ref 3.5–5.1)
SODIUM: 134 mmol/L — AB (ref 135–145)

## 2017-11-13 LAB — CBC WITH DIFFERENTIAL/PLATELET
Basophils Absolute: 0.1 10*3/uL (ref 0–0.1)
Basophils Relative: 1 %
EOS PCT: 1 %
Eosinophils Absolute: 0.1 10*3/uL (ref 0–0.7)
HCT: 23 % — ABNORMAL LOW (ref 35.0–47.0)
Hemoglobin: 7.6 g/dL — ABNORMAL LOW (ref 12.0–16.0)
LYMPHS ABS: 0.4 10*3/uL — AB (ref 1.0–3.6)
LYMPHS PCT: 5 %
MCH: 29.2 pg (ref 26.0–34.0)
MCHC: 33 g/dL (ref 32.0–36.0)
MCV: 88.6 fL (ref 80.0–100.0)
Monocytes Absolute: 0.3 10*3/uL (ref 0.2–0.9)
Monocytes Relative: 4 %
NEUTROS PCT: 89 %
Neutro Abs: 8.2 10*3/uL — ABNORMAL HIGH (ref 1.4–6.5)
PLATELETS: 259 10*3/uL (ref 150–440)
RBC: 2.59 MIL/uL — AB (ref 3.80–5.20)
RDW: 15.1 % — ABNORMAL HIGH (ref 11.5–14.5)
WBC: 9.2 10*3/uL (ref 3.6–11.0)

## 2017-11-13 LAB — GLUCOSE, CAPILLARY
GLUCOSE-CAPILLARY: 101 mg/dL — AB (ref 65–99)
GLUCOSE-CAPILLARY: 134 mg/dL — AB (ref 65–99)
GLUCOSE-CAPILLARY: 152 mg/dL — AB (ref 65–99)
Glucose-Capillary: 140 mg/dL — ABNORMAL HIGH (ref 65–99)

## 2017-11-13 LAB — HEPARIN LEVEL (UNFRACTIONATED): Heparin Unfractionated: 0.59 IU/mL (ref 0.30–0.70)

## 2017-11-13 NOTE — Consult Note (Signed)
Consultation Note Date: 11/13/2017   Patient Name: Cathy Guzman  DOB: 1946/02/27  MRN: [id number]  Age / Sex: 72 y.o., female  PCP: Dorothey Baseman, MD Referring Physician: Altamese Dilling, *  Reason for Consultation: Establishing goals of care and Psychosocial/spiritual support  HPI/Patient Profile: 72 y.o. female  admitted on 11/10/2017 with  a known history of acute myocardial infarction, bipolar disorder, diastolic congestive heart failure, essential hypertension, gout, hyperlipidemia, iron deficiency anemia, COPD on chronic home oxygen use, type 2 diabetes- has repeated admissions in last 1 month- initially at [facility] and last week admitted here with hyponatremia secondary to multiple diuretics use, which improved after holding the diuretics and she was sent to rehabilitation 2 days ago without diuretics prescriptions.   She is again readmitted with overall failure to thrive secondary to acute on chronic respiratory failure with hypoxia, COPD exacerbation, acute on chronic diastolic heart failure and stage IV chronic renal failure.  Since yesterday at rehabilitation she is more short of breath, requiring up to 4 L oxygen and having tachypnea so today to send her to emergency room. She was noted to have some pulmonary edema and was having increased work of breathing so started on BiPAP by ER physician.  Appears from chart reviews that most of patient's care has been delivered in the [facility] healthcare vicinity  Patient reports continued physical and functional decline over the past many months secondary to multiple comorbidities.    Patient and her family face advance care decisions and anticipatory care needs.   Clinical Assessment and Goals of Care:  This NP Lorinda Creed reviewed medical records, received report from team, assessed the patient and then meet at the patient's bedside  to discuss  diagnosis, prognosis, GOC, EOL wishes disposition and options.  Concept of Hospice and Palliative Care were discussed.  Detailed eligibility for hospice benefit with patient  A  discussion was had today regarding advanced directives.   The difference between a aggressive medical intervention path  and a palliative comfort care path for this patient at this time was had.  Values and goals of care important to patient and family were attempted to be elicited.  MOST form introduced  Natural trajectory and expectations at EOL were discussed.  Questions and concerns addressed.  Patient  encouraged to call with questions or concerns.    PMT will continue to support holistically.   I placed a call to her daughter Han Lysne with the patient's permission in hopes of further conversation regarding the above and its impact on transition of care. Await callback.   Patient tells me that she and her daughter are trying to "figure something out" as far as where patient can transition to for long-term care.    PATIENT is her own medical decision maker with support of her daughter     SUMMARY OF RECOMMENDATIONS    Code Status/Advance Care Planning:  DNR   Palliative Prophylaxis:   Aspiration, Bowel Regimen, Delirium Protocol, Frequent Pain Assessment and Oral Care  Additional Recommendations (Limitations, Scope,  Preferences):  Full Scope Treatment  Psycho-social/Spiritual:   Desire for further Chaplaincy support:no  Additional Recommendations: Education on Hospice  Prognosis:   Unable to determine  Discharge Planning: To Be Determined      Primary Diagnoses: Present on Admission: . Acute on chronic respiratory failure (HCC)   I have reviewed the medical record, interviewed the patient and family, and examined the patient. The following aspects are pertinent.  Past Medical History:  Diagnosis Date  . Acute myocardial infarction, subendocardial infarction, initial episode  of care (HCC)   . Anginal syndrome (HCC)   . Atopic rhinitis   . Avitaminosis D   . Bacteremia   . Better eye: severe vision impairment; lesser eye: blind   . Bipolar disorder, unspecified (HCC)   . Cannot walk   . CHF (congestive heart failure) (HCC)   . CN (constipation)   . Coronary atherosclerosis of native coronary artery   . Encounter for vocational therapy   . Esophageal reflux   . Essential hypertension, malignant   . Gout, unspecified   . Heart failure, diastolic (HCC)   . Hyperlipemia   . Hypomagnesemia   . Hypopotassemia   . Iron deficiency anemia, unspecified   . Muscle weakness (generalized)   . Obstructive chronic bronchitis without exacerbation (HCC)   . Oropharyngeal dysphagia   . Persistent disorder of initiating or maintaining sleep   . Rash and other nonspecific skin eruption   . Scar painful   . Scissor gait   . Shortness of breath   . Spasm of muscle   . Type II diabetes mellitus with renal manifestations (HCC)   . Urinary tract infection, site not specified    Social History   Socioeconomic History  . Marital status: Divorced    Spouse name: None  . Number of children: None  . Years of education: None  . Highest education level: None  Social Needs  . Financial resource strain: None  . Food insecurity - worry: None  . Food insecurity - inability: None  . Transportation needs - medical: None  . Transportation needs - non-medical: None  Occupational History  . None  Tobacco Use  . Smoking status: Never Smoker  . Smokeless tobacco: Never Used  Substance and Sexual Activity  . Alcohol use: No    Frequency: Never  . Drug use: No  . Sexual activity: None  Other Topics Concern  . None  Social History Narrative  . None   Family History  Problem Relation Age of Onset  . CAD Mother   . Glaucoma Mother   . COPD Father   . COPD Brother   . Parkinsonism Brother    Scheduled Meds: . allopurinol  100 mg Oral Daily  . ARIPiprazole  20 mg  Oral Daily  . aspirin EC  81 mg Oral Daily  . atorvastatin  80 mg Oral Daily  . budesonide (PULMICORT) nebulizer solution  0.25 mg Nebulization BID  . chlorhexidine  15 mL Mouth Rinse BID  . clopidogrel  75 mg Oral Daily  . diclofenac sodium  2 g Topical QID  . dorzolamide  1 drop Right Eye BID  . ferrous sulfate  325 mg Oral Q breakfast  . fluticasone  1 spray Each Nare Daily  . furosemide  40 mg Oral Daily  . insulin aspart  0-9 Units Subcutaneous TID WC  . insulin glargine  15 Units Subcutaneous QHS  . ipratropium-albuterol  3 mL Nebulization Q4H  . isosorbide mononitrate  60  mg Oral Daily  . lamoTRIgine  150 mg Oral Daily  . mouth rinse  15 mL Mouth Rinse q12n4p  . metoprolol tartrate  12.5 mg Oral BID  . omega-3 acid ethyl esters  1,000 mg Oral QPM  . pantoprazole  40 mg Oral BID AC  . polyethylene glycol  17 g Oral Daily  . predniSONE  10 mg Oral Q breakfast  . QUEtiapine  25 mg Oral BID  . sertraline  200 mg Oral QHS  . sucralfate  1 g Oral TID AC & HS  . tiotropium  18 mcg Inhalation Daily   Continuous Infusions: . heparin 1,000 Units/hr (11/12/17 1730)   PRN Meds:.acetaminophen, alum & mag hydroxide-simeth, diphenhydrAMINE, docusate sodium, Melatonin, nitroGLYCERIN, senna Medications Prior to Admission:  Prior to Admission medications   Medication Sig Start Date End Date Taking? Authorizing Provider  albuterol (PROVENTIL HFA;VENTOLIN HFA) 108 (90 Base) MCG/ACT inhaler Inhale 2 puffs into the lungs every 3 (three) hours as needed for wheezing or shortness of breath.   Yes [provider]  allopurinol (ZYLOPRIM) 100 MG tablet Take 100 mg by mouth daily.   Yes [provider]  alum & mag hydroxide-simeth (MAALOX/MYLANTA) 200-200-20 MG/5ML suspension Take by mouth every 4 (four) hours as needed for indigestion or heartburn.   Yes [provider]  amLODipine (NORVASC) 10 MG tablet Take 10 mg by mouth daily.   Yes [provider]    ARIPiprazole (ABILIFY) 20 MG tablet Take 20 mg by mouth daily.   Yes [provider]  aspirin EC 81 MG tablet Take 81 mg by mouth daily.   Yes [provider]  atorvastatin (LIPITOR) 80 MG tablet Take 80 mg by mouth daily.   Yes [provider]  clopidogrel (PLAVIX) 75 MG tablet Take 75 mg by mouth daily.   Yes [provider]  diclofenac sodium (VOLTAREN) 1 % GEL Apply 2 g topically 4 (four) times daily.   Yes [provider]  dorzolamide (TRUSOPT) 2 % ophthalmic solution Place 1 drop into the right eye 2 (two) times daily.   Yes [provider]  ferrous sulfate 325 (65 FE) MG EC tablet Take 325 mg by mouth daily with breakfast.   Yes [provider]  fluticasone (FLONASE) 50 MCG/ACT nasal spray Place 1 spray into both nostrils daily.   Yes [provider]  Fluticasone-Salmeterol (ADVAIR) 500-50 MCG/DOSE AEPB Inhale 1 puff into the lungs 2 (two) times daily.   Yes [provider]  insulin glargine (LANTUS) 100 UNIT/ML injection Inject 0.1 mLs (10 Units total) into the skin at bedtime. 11/08/17  Yes Enedina Finner, MD  isosorbide mononitrate (IMDUR) 30 MG 24 hr tablet Take 90 mg by mouth daily.   Yes [provider]  lamoTRIgine (LAMICTAL) 100 MG tablet Take 150 mg by mouth daily.   Yes [provider]  Melatonin 10 MG TABS Take 10 mg by mouth at bedtime as needed (for sleeping).   Yes [provider]  metoprolol tartrate (LOPRESSOR) 50 MG tablet Take 100 mg by mouth 2 (two) times daily.   Yes [provider]  Omega 3 1000 MG CAPS Take 1,000 mg by mouth every evening.   Yes [provider]  omega-3 acid ethyl esters (LOVAZA) 1 g capsule Take 1 g by mouth daily.   Yes [provider]  omeprazole (PRILOSEC) 20 MG capsule Take 40 mg by mouth 2 (two) times daily before a meal.   Yes [provider]  polyethylene glycol (MIRALAX / GLYCOLAX) packet Take 17 g by  mouth daily.   Yes [provider]  QUEtiapine (SEROQUEL) 25 MG tablet Take 25 mg by mouth 2 (two) times daily.   Yes [provider]  QUEtiapine (SEROQUEL) 50 MG tablet Take 50 mg by mouth at bedtime.   Yes [provider]  senna (SENOKOT) 8.6 MG TABS tablet Take 2 tablets by mouth at bedtime as needed for mild constipation.   Yes [provider]  sertraline (ZOLOFT) 100 MG tablet Take 200 mg by mouth at bedtime.   Yes [provider]  sucralfate (CARAFATE) 1 g tablet Take 1 g by mouth 4 (four) times daily.   Yes [provider]  tiotropium (SPIRIVA) 18 MCG inhalation capsule Place 18 mcg into inhaler and inhale daily.   Yes [provider]   Allergies  Allergen Reactions  . Ace Inhibitors     unknown  . Bactrim [Sulfamethoxazole-Trimethoprim] Itching  . Doxycycline Hyclate     unknown  . Hydrocodone     unknown  . Oxycodone     unknown  . Sulfanilamide     unknown   Review of Systems  Constitutional: Positive for fatigue.  Neurological: Positive for weakness.    Physical Exam  Constitutional: She is oriented to person, place, and time. She appears well-developed. She is cooperative. She appears ill.  Cardiovascular: Normal rate and regular rhythm.  Pulmonary/Chest: She has decreased breath sounds in the right lower field and the left lower field.  Neurological: She is alert and oriented to person, place, and time.  Skin: Skin is warm and dry.    Vital Signs: BP (!) 106/55 (BP Location: Right Arm)   Pulse 79   Temp 97.8 F (36.6 C) (Oral)   Resp 14   Ht 5\' 8"  (1.727 m)   Wt 117.7 kg (259 lb 6.4 oz)   SpO2 97%   BMI 39.44 kg/m  Pain Assessment: 0-10   Pain Score: Asleep   SpO2: SpO2: 97 % O2 Device:SpO2: 97 % O2 Flow Rate: .O2 Flow Rate (L/min): 5 L/min  IO: Intake/output summary:   Intake/Output Summary (Last 24 hours) at 11/13/2017 1439 Last data filed at 11/13/2017 1051 Gross per 24 hour  Intake  521.67 ml  Output 950 ml  Net -428.33 ml    LBM: Last BM Date: (Unknown, stool softner offered) Baseline Weight: Weight: 115.7 kg (255 lb) Most recent weight: Weight: 117.7 kg (259 lb 6.4 oz)     Palliative Assessment/Data: 30 %    Discussed with social work and case management and bedside nurse  Time In: 1345 Time Out: 1500 Time Total: 75 min Greater than 50%  of this time was spent counseling and coordinating care related to the above assessment and plan.  Signed by: Lorinda Creed, NP   Please contact Palliative Medicine Team phone at [phone] for questions and concerns.  For individual provider: See Loretha Stapler

## 2017-11-13 NOTE — Clinical Social Work Note (Signed)
CSW spoke with patient and her daughter Nyoka LintCindy Mineau, [phone] or [phone], they would like CSW to pursue a different SNF.  Patient's daughter is requesting either [facility] [place] [phone], [place], [facility] [phone].  CSW left message with [facility] [place] waiting for call back, [facility] [place] did not have any beds available, [facility], will review patient's information and let CSW know, and [facility] [phone] and left a message.  Ervin KnackEric R. Roniyah Llorens, MSW, Theresia MajorsLCSWA [phone]  11/13/2017 5:10 PM

## 2017-11-13 NOTE — Progress Notes (Addendum)
[facility]   PATIENT NAME: Cathy KassCynthia Guzman    MR#:  [id number]  DATE OF BIRTH:  08/22/1946  SUBJECTIVE:  CHIEF COMPLAINT:   Chief Complaint  Patient presents with  . Shortness of Breath   Came with shortness of breath and pulmonary edema, initially was on BiPAP, came off. Feeling better today, had some chest pain and noted elevated troponin, on heparin IV drip. No shortness of breath today, have generalized weakness.  REVIEW OF SYSTEMS:  CONSTITUTIONAL: No fever, fatigue or weakness.  EYES: No blurred or double vision.  EARS, NOSE, AND THROAT: No tinnitus or ear pain.  RESPIRATORY: No cough, have shortness of breath, wheezing or hemoptysis.  CARDIOVASCULAR: No chest pain, orthopnea, edema.  GASTROINTESTINAL: No nausea, vomiting, diarrhea or abdominal pain.  GENITOURINARY: No dysuria, hematuria.  ENDOCRINE: No polyuria, nocturia,  HEMATOLOGY: No anemia, easy bruising or bleeding SKIN: No rash or lesion. MUSCULOSKELETAL: No joint pain or arthritis.   NEUROLOGIC: No tingling, numbness, weakness.  PSYCHIATRY: No anxiety or depression.   ROS  DRUG ALLERGIES:   Allergies  Allergen Reactions  . Ace Inhibitors     unknown  . Bactrim [Sulfamethoxazole-Trimethoprim] Itching  . Doxycycline Hyclate     unknown  . Hydrocodone     unknown  . Oxycodone     unknown  . Sulfanilamide     unknown    VITALS:  Blood pressure (!) 106/55, pulse 79, temperature 97.8 F (36.6 C), temperature source Oral, resp. rate 14, height 5\' 8"  (1.727 m), weight 117.7 kg (259 lb 6.4 oz), SpO2 97 %.  PHYSICAL EXAMINATION:   GENERAL:  72 y.o.-year-old patient lying in the bed with acute distress.  EYES: Pupils equal, round, reactive to light and accommodation. No scleral icterus. Extraocular muscles intact.  HEENT: Head atraumatic, normocephalic. Oropharynx and nasopharynx clear. Nasal packing in place. NECK:  Supple, no jugular venous distention. No thyroid  enlargement, no tenderness.  LUNGS: Normal breath sounds bilaterally, some bilateral wheezing, some crepitation. Positive use of accessory muscles of respiration. On nasal cannula oxygen now.Marland Kitchen. CARDIOVASCULAR: S1, S2 normal. No murmurs, rubs, or gallops.  ABDOMEN: Soft, nontender, nondistended. Bowel sounds present. No organomegaly or mass.  EXTREMITIES: No pedal edema, cyanosis, or clubbing.  NEUROLOGIC: Cranial nerves II through XII are intact. Muscle strength 3-4/5 in all extremities. Sensation intact. Gait not checked.  PSYCHIATRIC: The patient is alert and oriented x 3.  SKIN: No obvious rash, lesion, or ulcer.     Physical Exam LABORATORY PANEL:   CBC Recent Labs  Lab 11/13/17 0304  WBC 9.2  HGB 7.6*  HCT 23.0*  PLT 259   ------------------------------------------------------------------------------------------------------------------  Chemistries  Recent Labs  Lab 11/07/17 0816  11/12/17 0608 11/13/17 0304  NA 123*   < > 129* 134*  K 3.6   < > 4.7 4.1  CL 82*   < > 85* 88*  CO2 32   < > 31 32  GLUCOSE 121*   < > 202* 119*  BUN 60*   < > 60* 61*  CREATININE 2.74*   < > 2.59* 2.48*  CALCIUM 8.6*   < > 9.3 9.6  MG  --   --  3.2*  --   AST 15  --   --   --   ALT 9*  --   --   --   ALKPHOS 89  --   --   --   BILITOT 0.5  --   --   --    < > =  values in this interval not displayed.   ------------------------------------------------------------------------------------------------------------------  Cardiac Enzymes Recent Labs  Lab 11/11/17 2146 11/12/17 0608  TROPONINI 3.07* 3.14*   ------------------------------------------------------------------------------------------------------------------  RADIOLOGY:  Dg Chest Port 1 View  Result Date: 11/11/2017 CLINICAL DATA:  Shortness of breath, pulmonary edema, respiratory distress. EXAM: PORTABLE CHEST 1 VIEW COMPARISON:  11/11/2017. FINDINGS: Cardiomegaly. Mild vascular congestion. Small BILATERAL effusions. No  definite consolidation or edema. No pneumothorax both change from priors when technique differences are considered. IMPRESSION: Stable chest. Electronically Signed   By: Elsie Stain M.D.   On: 11/11/2017 17:31    ASSESSMENT AND PLAN:   Principal Problem:   Acute on chronic respiratory failure with hypoxia (HCC) Active Problems:   COPD with acute exacerbation (HCC)   Acute on chronic respiratory failure (HCC)   DNR (do not resuscitate)   Palliative care by specialist   Stage 4 chronic kidney disease (HCC)   Acute congestive heart failure (HCC)  * Acute on chronic respiratory failure with hypoxia   This is secondary to COPD and CHF   on BiPAP on admission, treat underlying reasons and monitor in stepdown.    now on nasal cannula oxygen, improved.    * Non-ST elevation MI   Heparin IV drip, cardiology consult.   Cardiologist suggested to continue heparin for 24 hours more and then stop and continue medical management.  * Ac COPD exacerbation   IV and inhaled steroids.   Nebulizer treatments.   Currently no fever or sputum production, calcitonin is not high, does not need antibiotic.  * Acute on chronic diastolic congestive heart failure   Her Lasix and metolazone Stopped in last admission   Slight pulmonary edema on x-ray, no edema on legs.  responded to IV Lasix and BiPAP, now on oral Lasix once a day and nasal cannula oxygen.  Due to Poor func status and NSTEMI, not a candidate for Cardiac rehab and CHF rehab program.  * Chronic renal failure stage 4   Monitor in hospital,    we may need to reevaluate need of Lasix on discharge.   May not be a good candidate for cardiac catheterization due to this.  * History of coronary artery disease   Continue aspirin, Plavix, atorvastatin    metoprolol started with small dose..  * Hypertension    resume isosorbide.   Small dose metoprolol.  * Diabetes   Continue Lantus, keep on sliding scale coverage.   All the records  are reviewed and case discussed with Care Management/Social Workerr. Management plans discussed with the patient, family and they are in agreement.  CODE STATUS: DO NOT RESUSCITATE.  TOTAL TIME TAKING CARE OF THIS PATIENT: 35 minutes.   Her daughter was present in the room during my visit today. POSSIBLE D/C IN 1-2 DAYS, DEPENDING ON CLINICAL CONDITION.   Altamese Dilling M.D on 11/13/2017   Between 7am to 6pm - Pager - [phone]  After 6pm go to [email or web address] - password [email or web address]  [facility]  Office  [phone]  CC: Primary care physician; Dorothey Baseman, MD  Note: This dictation was prepared with Dragon dictation along with smaller phrase technology. Any transcriptional errors that result from this process are unintentional.

## 2017-11-13 NOTE — Care Management (Signed)
Attending expects discharge within next 24 hours.  Discussed having palliative follow patient at facility.  Patient does have DNR.  Attending has ordered a palliative consult for goals of care.

## 2017-11-13 NOTE — Progress Notes (Signed)
ANTICOAGULATION CONSULT NOTE - Follow up Consult  Pharmacy Consult for heparin gtt Indication: chest pain/ACS  Allergies  Allergen Reactions  . Ace Inhibitors     unknown  . Bactrim [Sulfamethoxazole-Trimethoprim] Itching  . Doxycycline Hyclate     unknown  . Hydrocodone     unknown  . Oxycodone     unknown  . Sulfanilamide     unknown    Patient Measurements: Height: 5\' 8"  (172.7 cm) Weight: 257 lb 15 oz (117 kg) IBW/kg (Calculated) : 63.9 Heparin Dosing Weight: 91.6kg  Vital Signs: Temp: 97.8 F (36.6 C) (02/03 2035) Temp Source: Oral (02/03 2035) BP: 132/67 (02/03 2037) Pulse Rate: 97 (02/03 2037)  Labs: Recent Labs    11/11/17 1843 11/11/17 2146  11/12/17 0608 11/12/17 1059 11/12/17 1851 11/13/17 0304  HGB  --  8.6*  --  7.7*  --   --  7.6*  HCT  --  26.0*  --  23.6*  --   --  23.0*  PLT  --  309  --  237  --   --  259  APTT  --  153*  --   --   --   --   --   LABPROT  --  16.6*  --   --   --   --   --   INR  --  1.35  --   --   --   --   --   HEPARINUNFRC  --   --    < >  --  0.80* 0.62 0.59  CREATININE  --  2.41*  --  2.59*  --   --  2.48*  TROPONINI 3.07* 3.07*  --  3.14*  --   --   --    < > = values in this interval not displayed.    Estimated Creatinine Clearance: 27.1 mL/min (A) (by C-G formula based on SCr of 2.48 mg/dL (H)).   Medical History: Past Medical History:  Diagnosis Date  . Acute myocardial infarction, subendocardial infarction, initial episode of care (HCC)   . Anginal syndrome (HCC)   . Atopic rhinitis   . Avitaminosis D   . Bacteremia   . Better eye: severe vision impairment; lesser eye: blind   . Bipolar disorder, unspecified (HCC)   . Cannot walk   . CHF (congestive heart failure) (HCC)   . CN (constipation)   . Coronary atherosclerosis of native coronary artery   . Encounter for vocational therapy   . Esophageal reflux   . Essential hypertension, malignant   . Gout, unspecified   . Heart failure, diastolic (HCC)    . Hyperlipemia   . Hypomagnesemia   . Hypopotassemia   . Iron deficiency anemia, unspecified   . Muscle weakness (generalized)   . Obstructive chronic bronchitis without exacerbation (HCC)   . Oropharyngeal dysphagia   . Persistent disorder of initiating or maintaining sleep   . Rash and other nonspecific skin eruption   . Scar painful   . Scissor gait   . Shortness of breath   . Spasm of muscle   . Type II diabetes mellitus with renal manifestations (HCC)   . Urinary tract infection, site not specified     Medications:  Medications Prior to Admission  Medication Sig Dispense Refill Last Dose  . albuterol (PROVENTIL HFA;VENTOLIN HFA) 108 (90 Base) MCG/ACT inhaler Inhale 2 puffs into the lungs every 3 (three) hours as needed for wheezing or shortness of breath.   prn  at prn  . allopurinol (ZYLOPRIM) 100 MG tablet Take 100 mg by mouth daily.   11/10/2017 at 0800  . alum & mag hydroxide-simeth (MAALOX/MYLANTA) 200-200-20 MG/5ML suspension Take by mouth every 4 (four) hours as needed for indigestion or heartburn.   prn at prn  . amLODipine (NORVASC) 10 MG tablet Take 10 mg by mouth daily.   11/10/2017 at 0800  . ARIPiprazole (ABILIFY) 20 MG tablet Take 20 mg by mouth daily.   11/10/2017 at 0800  . aspirin EC 81 MG tablet Take 81 mg by mouth daily.   11/10/2017 at 0800  . atorvastatin (LIPITOR) 80 MG tablet Take 80 mg by mouth daily.   11/10/2017 at 0800  . clopidogrel (PLAVIX) 75 MG tablet Take 75 mg by mouth daily.   11/10/2017 at 0800  . diclofenac sodium (VOLTAREN) 1 % GEL Apply 2 g topically 4 (four) times daily.   11/09/2017 at 1700  . dorzolamide (TRUSOPT) 2 % ophthalmic solution Place 1 drop into the right eye 2 (two) times daily.   11/09/2017 at 2100  . ferrous sulfate 325 (65 FE) MG EC tablet Take 325 mg by mouth daily with breakfast.   11/10/2017 at 0800  . fluticasone (FLONASE) 50 MCG/ACT nasal spray Place 1 spray into both nostrils daily.   11/10/2017 at 0800  . Fluticasone-Salmeterol  (ADVAIR) 500-50 MCG/DOSE AEPB Inhale 1 puff into the lungs 2 (two) times daily.   11/10/2017 at 0800  . insulin glargine (LANTUS) 100 UNIT/ML injection Inject 0.1 mLs (10 Units total) into the skin at bedtime. 10 mL 11 11/09/2017 at 2100  . isosorbide mononitrate (IMDUR) 30 MG 24 hr tablet Take 90 mg by mouth daily.   11/10/2017 at 0800  . lamoTRIgine (LAMICTAL) 100 MG tablet Take 150 mg by mouth daily.   11/10/2017 at 0800  . Melatonin 10 MG TABS Take 10 mg by mouth at bedtime as needed (for sleeping).   prn at prn  . metoprolol tartrate (LOPRESSOR) 50 MG tablet Take 100 mg by mouth 2 (two) times daily.   11/10/2017 at 0800  . Omega 3 1000 MG CAPS Take 1,000 mg by mouth every evening.   11/09/2017 at 1800  . omega-3 acid ethyl esters (LOVAZA) 1 g capsule Take 1 g by mouth daily.   11/09/2017 at 1800  . omeprazole (PRILOSEC) 20 MG capsule Take 40 mg by mouth 2 (two) times daily before a meal.   11/10/2017 at 0800  . polyethylene glycol (MIRALAX / GLYCOLAX) packet Take 17 g by mouth daily.   11/09/2017 at 1800  . QUEtiapine (SEROQUEL) 25 MG tablet Take 25 mg by mouth 2 (two) times daily.   11/10/2017 at 0600  . QUEtiapine (SEROQUEL) 50 MG tablet Take 50 mg by mouth at bedtime.   11/09/2017 at 2000  . senna (SENOKOT) 8.6 MG TABS tablet Take 2 tablets by mouth at bedtime as needed for mild constipation.   prn at prn  . sertraline (ZOLOFT) 100 MG tablet Take 200 mg by mouth at bedtime.   11/09/2017 at 2000  . sucralfate (CARAFATE) 1 g tablet Take 1 g by mouth 4 (four) times daily.   11/10/2017 at 0800  . tiotropium (SPIRIVA) 18 MCG inhalation capsule Place 18 mcg into inhaler and inhale daily.   11/09/2017 at 0800   Scheduled:  . allopurinol  100 mg Oral Daily  . ARIPiprazole  20 mg Oral Daily  . aspirin EC  81 mg Oral Daily  . atorvastatin  80 mg  Oral Daily  . budesonide (PULMICORT) nebulizer solution  0.25 mg Nebulization BID  . chlorhexidine  15 mL Mouth Rinse BID  . clopidogrel  75 mg Oral Daily  . diclofenac  sodium  2 g Topical QID  . dorzolamide  1 drop Right Eye BID  . ferrous sulfate  325 mg Oral Q breakfast  . fluticasone  1 spray Each Nare Daily  . furosemide  40 mg Oral Daily  . insulin aspart  0-9 Units Subcutaneous TID WC  . insulin glargine  15 Units Subcutaneous QHS  . ipratropium-albuterol  3 mL Nebulization Q4H  . isosorbide mononitrate  60 mg Oral Daily  . lamoTRIgine  150 mg Oral Daily  . mouth rinse  15 mL Mouth Rinse q12n4p  . metoprolol tartrate  12.5 mg Oral BID  . omega-3 acid ethyl esters  1,000 mg Oral QPM  . pantoprazole  40 mg Oral BID AC  . polyethylene glycol  17 g Oral Daily  . predniSONE  10 mg Oral Q breakfast  . QUEtiapine  25 mg Oral BID  . sertraline  200 mg Oral QHS  . sucralfate  1 g Oral TID AC & HS  . tiotropium  18 mcg Inhalation Daily   Infusions:  . heparin 1,000 Units/hr (11/12/17 1730)   PRN: acetaminophen, alum & mag hydroxide-simeth, diphenhydrAMINE, docusate sodium, Melatonin, nitroGLYCERIN, senna Anti-infectives (From admission, onward)   None      Assessment: 72 year old female who just got heparin 5000units subq, started on heparin gtt for ACS/Chest pain with rising troponins. Will give half bolus and start on usual heparin rate per protocol    Goal of Therapy:  Heparin level 0.3-0.7 units/ml Monitor platelets by anticoagulation protocol: Yes   Plan:  Give 2000 units bolus x 1 Start heparin infusion at 1250 units/hr Check anti-Xa level in 6 hours and daily while on heparin Continue to monitor H&H and platelets   02/03 @ 0300 HL 0.87 supratherapeutic. Will decrease rate to heparin 1100 units/hr, patient not bleeding per RN, will recheck HL @ 1100. Will check CBC w/ am labs.    2/3 ~ 11:00  HL = 0.8.  Decreased drip rate to 1000 units/hr.  Will recheck HL tonight at 19:00.   11/12/2017 18:51 HL therapeutic x 1. Continue current rate. Will recheck HL in 8 hours.  02/04 @ 0300 HL 0.59 therapeutic. Will continue current rate and  will recheck HL w/ am labs.  Thomasene Ripple, [facility] Clinical Pharmacist 11/13/2017, 11:54 AM

## 2017-11-13 NOTE — Care Management Important Message (Signed)
Important Message  Patient Details  Name: Cathy KassCynthia Guzman MRN: [id number] Date of Birth: 10/15/1945   Medicare Important Message Given:  Yes Signed IM notice given    Eber HongGreene, Itai Barbian R, RN 11/13/2017, 2:00 PM

## 2017-11-13 NOTE — Progress Notes (Signed)
[facility] Cardiology  SUBJECTIVE: Patient laying in bed, reports feeling much better, currently denies chest pain   Vitals:   11/13/17 0322 11/13/17 0440 11/13/17 0739 11/13/17 0801  BP:  (!) 110/39  (!) 106/55  Pulse:  88  79  Resp:  18  14  Temp:  97.7 F (36.5 C)  97.8 F (36.6 C)  TempSrc:  Oral  Oral  SpO2: 97% 98% 96% 97%  Weight:  117.7 kg (259 lb 6.4 oz)    Height:         Intake/Output Summary (Last 24 hours) at 11/13/2017 0813 Last data filed at 11/13/2017 0441 Gross per 24 hour  Intake 281.67 ml  Output 1150 ml  Net -868.33 ml      PHYSICAL EXAM  General: Well developed, well nourished, in no acute distress HEENT:  Normocephalic and atramatic Neck:  No JVD.  Lungs: Clear bilaterally to auscultation and percussion. Heart: HRRR . Normal S1 and S2 without gallops or murmurs.  Abdomen: Bowel sounds are positive, abdomen soft and non-tender  Msk:  Back normal, normal gait. Normal strength and tone for age. Extremities: No clubbing, cyanosis or edema.   Neuro: Alert and oriented X 3. Psych:  Good affect, responds appropriately   LABS: Basic Metabolic Panel: Recent Labs    11/12/17 0608 11/13/17 0304  NA 129* 134*  K 4.7 4.1  CL 85* 88*  CO2 31 32  GLUCOSE 202* 119*  BUN 60* 61*  CREATININE 2.59* 2.48*  CALCIUM 9.3 9.6  MG 3.2*  --   PHOS 4.3 3.2   Liver Function Tests: Recent Labs    11/13/17 0304  ALBUMIN 2.9*   No results for input(s): LIPASE, AMYLASE in the last 72 hours. CBC: Recent Labs    11/10/17 0904  11/12/17 0608 11/13/17 0304  WBC 11.8*   < > 8.4 9.2  NEUTROABS 10.4*  --   --  8.2*  HGB 9.2*   < > 7.7* 7.6*  HCT 28.5*   < > 23.6* 23.0*  MCV 90.4   < > 89.9 88.6  PLT 263   < > 237 259   < > = values in this interval not displayed.   Cardiac Enzymes: Recent Labs    11/11/17 1843 11/11/17 2146 11/12/17 0608  TROPONINI 3.07* 3.07* 3.14*   BNP: Invalid input(s): POCBNP D-Dimer: No results for input(s): DDIMER in the last 72  hours. Hemoglobin A1C: No results for input(s): HGBA1C in the last 72 hours. Fasting Lipid Panel: No results for input(s): CHOL, HDL, LDLCALC, TRIG, CHOLHDL, LDLDIRECT in the last 72 hours. Thyroid Function Tests: No results for input(s): TSH, T4TOTAL, T3FREE, THYROIDAB in the last 72 hours.  Invalid input(s): FREET3 Anemia Panel: No results for input(s): VITAMINB12, FOLATE, FERRITIN, TIBC, IRON, RETICCTPCT in the last 72 hours.  Dg Chest Port 1 View  Result Date: 11/11/2017 CLINICAL DATA:  Shortness of breath, pulmonary edema, respiratory distress. EXAM: PORTABLE CHEST 1 VIEW COMPARISON:  11/11/2017. FINDINGS: Cardiomegaly. Mild vascular congestion. Small BILATERAL effusions. No definite consolidation or edema. No pneumothorax both change from priors when technique differences are considered. IMPRESSION: Stable chest. Electronically Signed   By: Elsie Stain M.D.   On: 11/11/2017 17:31     Echo pending  TELEMETRY: Normal sinus rhythm, right bundle branch block:  ASSESSMENT AND PLAN:  Principal Problem:   Acute on chronic respiratory failure with hypoxia (HCC) Active Problems:   COPD with acute exacerbation (HCC)   Acute on chronic respiratory failure (HCC)  1.  Elevated troponin, possible non-STEMI versus demand supply ischemia, in the setting of COPD exacerbation and stage IV chronic kidney disease 2.  COPD exacerbation 3.  Acute on chronic diastolic congestive heart failure, improved after diuresis 4.  Stage IV chronic kidney disease, extremely high risk for contrast-induced nephrotoxicity and renal failure  Recommendations  1.  Agree with current therapy 2.  Continue heparin drip 24 hours 3.  Defer cardiac catheterization in light of patient's extensive comorbidities and poor general health.  Patient agrees to proceed with conservative management.   Marcina MillardAlexander Christeena Krogh, MD, PhD, [facility] 11/13/2017 8:13 AM

## 2017-11-13 NOTE — Evaluation (Addendum)
Clinical/Bedside Swallow Evaluation Patient Details  Name: Cathy Guzman MRN: [id number] Date of Birth: 09/14/1946  Today's Date: 11/13/2017 Time: SLP Start Time (ACUTE ONLY): 1300 SLP Stop Time (ACUTE ONLY): 1400 SLP Time Calculation (min) (ACUTE ONLY): 60 min  Past Medical History:  Past Medical History:  Diagnosis Date  . Acute myocardial infarction, subendocardial infarction, initial episode of care (HCC)   . Anginal syndrome (HCC)   . Atopic rhinitis   . Avitaminosis D   . Bacteremia   . Better eye: severe vision impairment; lesser eye: blind   . Bipolar disorder, unspecified (HCC)   . Cannot walk   . CHF (congestive heart failure) (HCC)   . CN (constipation)   . Coronary atherosclerosis of native coronary artery   . Encounter for vocational therapy   . Esophageal reflux   . Essential hypertension, malignant   . Gout, unspecified   . Heart failure, diastolic (HCC)   . Hyperlipemia   . Hypomagnesemia   . Hypopotassemia   . Iron deficiency anemia, unspecified   . Muscle weakness (generalized)   . Obstructive chronic bronchitis without exacerbation (HCC)   . Oropharyngeal dysphagia   . Persistent disorder of initiating or maintaining sleep   . Rash and other nonspecific skin eruption   . Scar painful   . Scissor gait   . Shortness of breath   . Spasm of muscle   . Type II diabetes mellitus with renal manifestations (HCC)   . Urinary tract infection, site not specified    Past Surgical History:  Past Surgical History:  Procedure Laterality Date  . ABDOMINAL HYSTERECTOMY    . ABDOMINAL SURGERY    . CHOLECYSTECTOMY    . GASTRIC BYPASS     HPI:  Pt is a 72 y.o. female with a known history multiple medical issues including Gastric Bypass, Reflux, acute myocardial infarction, bipolar disorder, diastolic congestive heart failure, obesity, essential hypertension, gout, hyperlipidemia, iron deficiency anemia, COPD on chronic home oxygen use, type 2 diabetes- has repeated  admissions in last 1 month- initially at [facility] and last week admitted here with hyponatremia secondary to multiple diuretics use, which improved after holding the diuretics and she was sent to rehabilitation 2 days ago without diuretics prescriptions. She also had acute on chronic renal failure. Initially, she is more short of breath, requiring up to 4 L oxygen and having tachypnea so today to send her to emergency room. She was noted to have some pulmonary edema and was having increased work of breathing so started on BiPAP by ER physician but now is on [place] support. Pt reported she had Gastric Bypass several years ago but noted her current weight. Pt endorses moderate s/s of REFLUX - any regurgitation of food/liquid material can increase risk for aspiration of Reflux and negatively impact the Pulmonary system.    Assessment / Plan / Recommendation Clinical Impression  Pt appears to present w/ adequate oropharyngeal phase swallow function w/ reduced risk for aspiration when following general aspiration precautions and when eating softer foods d/t lacking bottom dentition. Pt consumed po trials of thin liquids via cup and straw then puree and soft solids w/ no immediate, overt s/s of aspiration noted. Vocal quality was clear b/t trials; no decline in respiratory status was apparent during/post trials. Oral phase appeared [facility] for bolus management; A-P transfer and clearing. Pt is missing lower dentition (molars) for sufficient masticaiton of tougher foods such as meats. No OM weakness or decreased ROM was noted in OM tasks. Pt fed  self given setup and support d/t overall weakness and quick SOB w/ exertion. Recommend a Minced diet, dysphagia level 2, diet consistency (easier if foods were minced for mastication and Esophageal motility); thin liquids. Recommend general aspiration precautions, Pills in Puree for easier swallowing (baseline). ST services will be available for further education if needed. Recommended  Dietician f/u d/t PMH of Gastic Bypass and pt's REFLUX - in hopes to reduce any Esophageal dysmotility or regurgitation. NSG updated.  SLP Visit Diagnosis: Dysphagia, pharyngoesophageal phase (R13.14)(REFLUX per pt)    Aspiration Risk  Mild aspiration risk(of regurgitated material; Esophageal/GI issues)    Diet Recommendation  Dysphagia level 2 (minced foods, moistened); thin liquids. General aspiration precautions. REFLUX precautions.   Medication Administration: Whole meds with puree(for easier clearing)    Other  Recommendations Recommended Consults: Consider GI evaluation;Consider esophageal assessment(Dietician f/u) Oral Care Recommendations: Oral care BID;Patient independent with oral care;Staff/trained caregiver to provide oral care Other Recommendations: (n/a)   Follow up Recommendations None      Frequency and Duration (n/a)  (n/a)       Prognosis Prognosis for Safe Diet Advancement: Fair(-Good) Barriers to Reach Goals: Time post onset;Severity of deficits(baseline GI issues)      Swallow Study   General Date of Onset: 11/10/17 HPI: Pt is a 72 y.o. female with a known history of acute myocardial infarction, bipolar disorder, diastolic congestive heart failure, essential hypertension, gout, hyperlipidemia, iron deficiency anemia, COPD on chronic home oxygen use, type 2 diabetes- has repeated admissions in last 1 month- initially at [facility] and last week admitted here with hyponatremia secondary to multiple diuretics use, which improved after holding the diuretics and she was sent to rehabilitation 2 days ago without diuretics prescriptions. She also had acute on chronic renal failure. Initially, she is more short of breath, requiring up to 4 L oxygen and having tachypnea so today to send her to emergency room. She was noted to have some pulmonary edema and was having increased work of breathing so started on BiPAP by ER physician but now is on [place] support. Pt reported she had Gastric  Bypass several years ago but noted her current weight. Pt endorses moderate s/s of REFLUX - any regurgitation of food/liquid material can increase risk for aspiration of Reflux and negatively impact the Pulmonary system.  Type of Study: Bedside Swallow Evaluation Previous Swallow Assessment: none reported Diet Prior to this Study: Dysphagia 1 (puree);Thin liquids(ordered currently; on regular diet at home per pt) Temperature Spikes Noted: No(wbc 9.2) Respiratory Status: Nasal cannula(5 liters) History of Recent Intubation: No Behavior/Cognition: Alert;Cooperative;Pleasant mood Oral Cavity Assessment: Within Functional Limits Oral Care Completed by SLP: Recent completion by staff Oral Cavity - Dentition: Dentures, top(no bottom dentition) Vision: Functional for self-feeding Self-Feeding Abilities: Able to feed self;Needs set up Patient Positioning: Upright in bed(required more upright positioning) Baseline Vocal Quality: Normal Volitional Cough: Strong Volitional Swallow: Able to elicit    Oral/Motor/Sensory Function Overall Oral Motor/Sensory Function: Within functional limits   Ice Chips Ice chips: Not tested   Thin Liquid Thin Liquid: Within functional limits Presentation: Cup;Self Fed;Straw(4 trials w/ each) Other Comments: denied any difficulty w/ liquids    Nectar Thick Nectar Thick Liquid: Not tested   Honey Thick Honey Thick Liquid: Not tested   Puree Puree: Within functional limits Presentation: Self Fed;Spoon(3 trials)   Solid   GO   Solid: Impaired(gave broken down foods d/t lacking dentition) Presentation: Self Fed(4 trials) Oral Phase Impairments: Impaired mastication(no bottom dentition) Pharyngeal Phase Impairments: (none)  Jerilynn SomKatherine Nashawn Hillock, MS, CCC-SLP Antanasia Kaczynski 11/13/2017,5:31 PM

## 2017-11-13 NOTE — Clinical Social Work Note (Addendum)
CSW attempted to contact patient's daughter Cathy Guzman, [phone] or [phone] to discuss discharge planning options.  CSW left a message awaiting for a call back from her.  CSW to continue to follow patient's progress throughout discharge planning.  3:00pm  CSW spoke with patient's daughter to discuss SNF placement options.  Patient and daughter were going to discuss if she wants to return back to [facility] or go [place] a [place] [facility].  CSW asked for patient's daughter to call back as soon as they discuss it and so CSW can determine if med search needs to be completed [place] [facility].  Ervin KnackEric R. Rigley Niess, MSW, Theresia MajorsLCSWA [phone]  11/13/2017 2:36 PM

## 2017-11-14 LAB — GLUCOSE, CAPILLARY
GLUCOSE-CAPILLARY: 112 mg/dL — AB (ref 65–99)
Glucose-Capillary: 159 mg/dL — ABNORMAL HIGH (ref 65–99)
Glucose-Capillary: 188 mg/dL — ABNORMAL HIGH (ref 65–99)

## 2017-11-14 LAB — BASIC METABOLIC PANEL
Anion gap: 12 (ref 5–15)
BUN: 64 mg/dL — AB (ref 6–20)
CALCIUM: 9.8 mg/dL (ref 8.9–10.3)
CHLORIDE: 91 mmol/L — AB (ref 101–111)
CO2: 34 mmol/L — ABNORMAL HIGH (ref 22–32)
CREATININE: 2.71 mg/dL — AB (ref 0.44–1.00)
GFR calc non Af Amer: 16 mL/min — ABNORMAL LOW (ref >60)
GFR, EST AFRICAN AMERICAN: 19 mL/min — AB (ref >60)
Glucose, Bld: 126 mg/dL — ABNORMAL HIGH (ref 65–99)
Potassium: 4.1 mmol/L (ref 3.5–5.1)
SODIUM: 137 mmol/L (ref 135–145)

## 2017-11-14 LAB — CBC
HCT: 24.3 % — ABNORMAL LOW (ref 35.0–47.0)
HEMOGLOBIN: 8.1 g/dL — AB (ref 12.0–16.0)
MCH: 29.7 pg (ref 26.0–34.0)
MCHC: 33.3 g/dL (ref 32.0–36.0)
MCV: 89.1 fL (ref 80.0–100.0)
Platelets: 211 10*3/uL (ref 150–440)
RBC: 2.72 MIL/uL — ABNORMAL LOW (ref 3.80–5.20)
RDW: 15.3 % — ABNORMAL HIGH (ref 11.5–14.5)
WBC: 5.7 10*3/uL (ref 3.6–11.0)

## 2017-11-14 LAB — HEPARIN LEVEL (UNFRACTIONATED): HEPARIN UNFRACTIONATED: 0.59 [IU]/mL (ref 0.30–0.70)

## 2017-11-14 MED ORDER — NITROGLYCERIN 0.4 MG SL SUBL: 0.4000 mg | SUBLINGUAL_TABLET | SUBLINGUAL | 0 refills | Status: AC | PRN

## 2017-11-14 MED ORDER — IPRATROPIUM-ALBUTEROL 0.5-2.5 (3) MG/3ML IN SOLN
3.0000 mL | Freq: Four times a day (QID) | RESPIRATORY_TRACT | Status: DC
  Administered 2017-11-14: 3 mL via RESPIRATORY_TRACT
  Filled 2017-11-14: qty 3

## 2017-11-14 MED ORDER — METOPROLOL TARTRATE 25 MG PO TABS: 12.5000 mg | ORAL_TABLET | Freq: Two times a day (BID) | ORAL | 0 refills | Status: AC

## 2017-11-14 MED ORDER — HEPARIN SODIUM (PORCINE) 5000 UNIT/ML IJ SOLN
5000.0000 [IU] | Freq: Three times a day (TID) | INTRAMUSCULAR | Status: DC
  Administered 2017-11-14: 5000 [IU] via SUBCUTANEOUS
  Filled 2017-11-14: qty 1

## 2017-11-14 MED ORDER — FUROSEMIDE 40 MG PO TABS: 40.0000 mg | ORAL_TABLET | Freq: Every day | ORAL | 0 refills | Status: AC

## 2017-11-14 MED ORDER — BUDESONIDE 0.25 MG/2ML IN SUSP: 0.2500 mg | Freq: Two times a day (BID) | RESPIRATORY_TRACT | 12 refills | Status: AC

## 2017-11-14 MED ORDER — IPRATROPIUM-ALBUTEROL 0.5-2.5 (3) MG/3ML IN SOLN: 3.0000 mL | Freq: Four times a day (QID) | RESPIRATORY_TRACT | 0 refills | Status: AC

## 2017-11-14 MED ORDER — ISOSORBIDE MONONITRATE ER 60 MG PO TB24: 60.0000 mg | ORAL_TABLET | Freq: Every day | ORAL | 0 refills | Status: AC

## 2017-11-14 NOTE — Progress Notes (Signed)
Report called to Dawn at [facility] [place].  They are familiar with patient.  I will call EMS for transport.

## 2017-11-14 NOTE — Progress Notes (Signed)
[facility] Cardiology  SUBJECTIVE: The patient reports feeling better this morning. She denies recurrence of the significant chest pain she had been having. She reports intermittent central chest discomfort, which she describes as sharp and rhythmic, comes and goes, brief in nature. She had a breathing treatment this morning, and reports resolution of her chest pain after that.    Vitals:   11/13/17 1933 11/13/17 2048 11/14/17 0338 11/14/17 0651  BP: (!) 146/75  127/62   Pulse: 86  84   Resp: 18  17   Temp: (!) 97.5 F (36.4 C)  97.7 F (36.5 C)   TempSrc: Oral  Oral   SpO2: 100% 97% 95%   Weight:    116.1 kg (255 lb 14.4 oz)  Height:         Intake/Output Summary (Last 24 hours) at 11/14/2017 1610 Last data filed at 11/13/2017 2100 Gross per 24 hour  Intake 240 ml  Output 2000 ml  Net -1760 ml      PHYSICAL EXAM  General: Chronically-ill appearing elderly female lying in bed at an incline in no acute distress HEENT:  Normocephalic and atramatic Neck:  No JVD.  Lungs: Supplemental oxygen use via nasal cannula, bilateral wheezing Heart: HRRR . Normal S1 and S2 without gallops or murmurs.  Abdomen: Obese, abdomen soft  Msk:  Back normal, normal gait. Normal strength and tone for age. Neuro: Alert and oriented X 3. Psych:  Good affect, responds appropriately   LABS: Basic Metabolic Panel: Recent Labs    11/12/17 0608 11/13/17 0304 11/14/17 0451  NA 129* 134* 137  K 4.7 4.1 4.1  CL 85* 88* 91*  CO2 31 32 34*  GLUCOSE 202* 119* 126*  BUN 60* 61* 64*  CREATININE 2.59* 2.48* 2.71*  CALCIUM 9.3 9.6 9.8  MG 3.2*  --   --   PHOS 4.3 3.2  --    Liver Function Tests: Recent Labs    11/13/17 0304  ALBUMIN 2.9*   No results for input(s): LIPASE, AMYLASE in the last 72 hours. CBC: Recent Labs    11/13/17 0304 11/14/17 0451  WBC 9.2 5.7  NEUTROABS 8.2*  --   HGB 7.6* 8.1*  HCT 23.0* 24.3*  MCV 88.6 89.1  PLT 259 211   Cardiac Enzymes: Recent Labs     11/11/17 1843 11/11/17 2146 11/12/17 0608  TROPONINI 3.07* 3.07* 3.14*   BNP: Invalid input(s): POCBNP D-Dimer: No results for input(s): DDIMER in the last 72 hours. Hemoglobin A1C: No results for input(s): HGBA1C in the last 72 hours. Fasting Lipid Panel: No results for input(s): CHOL, HDL, LDLCALC, TRIG, CHOLHDL, LDLDIRECT in the last 72 hours. Thyroid Function Tests: No results for input(s): TSH, T4TOTAL, T3FREE, THYROIDAB in the last 72 hours.  Invalid input(s): FREET3 Anemia Panel: No results for input(s): VITAMINB12, FOLATE, FERRITIN, TIBC, IRON, RETICCTPCT in the last 72 hours.  No results found.   Echo: EF >55%, 10/04/2017  TELEMETRY: sinus rhythm, 95 bpm  ASSESSMENT AND PLAN:  Principal Problem:   Acute on chronic respiratory failure with hypoxia (HCC) Active Problems:   COPD with acute exacerbation (HCC)   Acute on chronic respiratory failure (HCC)   DNR (do not resuscitate)   Palliative care by specialist   Stage 4 chronic kidney disease (HCC)   Acute congestive heart failure (HCC)    1. Elevated troponin, possible non-STEMI versus demand supply ischemia in the setting of COPD exacerbation and stage IV chronic kidney disease. 2. Acute on chronic diastolic CHF, improved  after diuresis 3. Stage IV CKD, extremely high risk for contrast-induced nephrotoxicity and renal failure  Recommendations: 1. Agree with current therapy 2. Discontinue heparin drip this morning 3. Defer cardiac catheterization in light of patient's extensive comorbidities and poor general health.  Patient agrees to proceed with conservative management. 4. Continue atorvastatin, aspirin, Plavix (with careful monitoring for bleeding side effects, CBC), furosemide (with careful monitoring of renal status), isosorbide mononitrate, and metoprolol tartrate.   Sign off for now; call with any questions.     Leanora Ivanoffnna Ethridge Sollenberger, PA-C 11/14/2017 8:29 AM

## 2017-11-14 NOTE — Discharge Instructions (Signed)
Heart Failure Clinic appointment on November 21 2017 at 12:00pm with Clarisa Kindredina Hackney, FNP. Please call [phone] to reschedule.   Diet Recommendation  Dysphagia level 2 (minced foods, moistened); thin liquids. General aspiration precautions. REFLUX precautions.   Medication Administration: Whole meds with puree(for easier clearing)     Palliative care nurse to follow at SNF after discharge.

## 2017-11-14 NOTE — Clinical Social Work Note (Addendum)
CSW spoke with patient and her daughter again, patient has decided to return back to [facility] [address].  Patient to be d/c'ed today to [facility] [place].  Patient and family agreeable to plans will transport via ems RN to call report 700 hall nurse [phone] room 713.  CSW updated patient's daughter Nyoka LintCindy Dade [phone].  Palliative is recommending that patient is followed by palliative at SNF.  CSW made referral to [facility] [place].  Windell MouldingEric Kiaraliz Rafuse, MSW, Theresia MajorsLCSWA [phone]

## 2017-11-14 NOTE — Progress Notes (Signed)
Patient stable for discharge.   PIV discontinued, catheter intact; site clean, dry, intact. Discharge instructions and follow-up reviewed. Patient verbalized understanding.   EMS at bedside to take patient to [facility].   Pt's daughter called and notified of pt's transfer, given discharge instructions as well; also told that discharge and prescriptions will be a [facility] with her mother.

## 2017-11-14 NOTE — Clinical Social Work Note (Addendum)
CSW spoke to [facility] admissions director Ena Dawleyania, [phone] who is reviewing patient's information and will call CSW back if they can accept her.  CSW also spoke to [place] in admissions [facility] who is also reviewing to determine if they can accept patient, and she will call CSW back as well.  CSW spoke with patient's daughter Aram BeechamCynthia, and informed her that if neither of the facilities can accept patient back she will have to discharge back to [facility] [place].  CSW informed her that per MD patient should be ready for discharge today.  Patient's daughter expressed understanding.  Ervin KnackEric R. Taiz Bickle, MSW, Theresia MajorsLCSWA [phone]  11/14/2017 10:29 AM

## 2017-11-14 NOTE — Progress Notes (Signed)
New referral for out patient Palliative to follow at [facility] received from CSW C.H. Robinson WorldwideEric Anterhaus. Patient to discharge today. Patient information faxed to referral. Dayna BarkerKaren Robertson RN, BSN, [facility] [place], hospital liaison

## 2017-11-14 NOTE — Progress Notes (Signed)
ANTICOAGULATION CONSULT NOTE - Follow up Consult  Pharmacy Consult for heparin gtt Indication: chest pain/ACS  Allergies  Allergen Reactions  . Ace Inhibitors     unknown  . Bactrim [Sulfamethoxazole-Trimethoprim] Itching  . Doxycycline Hyclate     unknown  . Hydrocodone     unknown  . Oxycodone     unknown  . Sulfanilamide     unknown    Patient Measurements: Height: 5\' 8"  (172.7 cm) Weight: 259 lb 6.4 oz (117.7 kg) IBW/kg (Calculated) : 63.9 Heparin Dosing Weight: 91.6kg  Vital Signs: Temp: 97.7 F (36.5 C) (02/05 0338) Temp Source: Oral (02/05 0338) BP: 127/62 (02/05 0338) Pulse Rate: 84 (02/05 0338)  Labs: Recent Labs    11/11/17 1843  11/11/17 2146  11/12/17 0608  11/12/17 1851 11/13/17 0304 11/14/17 0451  HGB  --    < > 8.6*  --  7.7*  --   --  7.6* 8.1*  HCT  --    < > 26.0*  --  23.6*  --   --  23.0* 24.3*  PLT  --    < > 309  --  237  --   --  259 211  APTT  --   --  153*  --   --   --   --   --   --   LABPROT  --   --  16.6*  --   --   --   --   --   --   INR  --   --  1.35  --   --   --   --   --   --   HEPARINUNFRC  --   --   --    < >  --    < > 0.62 0.59 0.59  CREATININE  --    < > 2.41*  --  2.59*  --   --  2.48* 2.71*  TROPONINI 3.07*  --  3.07*  --  3.14*  --   --   --   --    < > = values in this interval not displayed.    Estimated Creatinine Clearance: 24.9 mL/min (A) (by C-G formula based on SCr of 2.71 mg/dL (H)).   Medical History: Past Medical History:  Diagnosis Date  . Acute myocardial infarction, subendocardial infarction, initial episode of care (HCC)   . Anginal syndrome (HCC)   . Atopic rhinitis   . Avitaminosis D   . Bacteremia   . Better eye: severe vision impairment; lesser eye: blind   . Bipolar disorder, unspecified (HCC)   . Cannot walk   . CHF (congestive heart failure) (HCC)   . CN (constipation)   . Coronary atherosclerosis of native coronary artery   . Encounter for vocational therapy   . Esophageal reflux    . Essential hypertension, malignant   . Gout, unspecified   . Heart failure, diastolic (HCC)   . Hyperlipemia   . Hypomagnesemia   . Hypopotassemia   . Iron deficiency anemia, unspecified   . Muscle weakness (generalized)   . Obstructive chronic bronchitis without exacerbation (HCC)   . Oropharyngeal dysphagia   . Persistent disorder of initiating or maintaining sleep   . Rash and other nonspecific skin eruption   . Scar painful   . Scissor gait   . Shortness of breath   . Spasm of muscle   . Type II diabetes mellitus with renal manifestations (HCC)   . Urinary tract  infection, site not specified     Medications:  Medications Prior to Admission  Medication Sig Dispense Refill Last Dose  . albuterol (PROVENTIL HFA;VENTOLIN HFA) 108 (90 Base) MCG/ACT inhaler Inhale 2 puffs into the lungs every 3 (three) hours as needed for wheezing or shortness of breath.   prn at prn  . allopurinol (ZYLOPRIM) 100 MG tablet Take 100 mg by mouth daily.   11/10/2017 at 0800  . alum & mag hydroxide-simeth (MAALOX/MYLANTA) 200-200-20 MG/5ML suspension Take by mouth every 4 (four) hours as needed for indigestion or heartburn.   prn at prn  . amLODipine (NORVASC) 10 MG tablet Take 10 mg by mouth daily.   11/10/2017 at 0800  . ARIPiprazole (ABILIFY) 20 MG tablet Take 20 mg by mouth daily.   11/10/2017 at 0800  . aspirin EC 81 MG tablet Take 81 mg by mouth daily.   11/10/2017 at 0800  . atorvastatin (LIPITOR) 80 MG tablet Take 80 mg by mouth daily.   11/10/2017 at 0800  . clopidogrel (PLAVIX) 75 MG tablet Take 75 mg by mouth daily.   11/10/2017 at 0800  . diclofenac sodium (VOLTAREN) 1 % GEL Apply 2 g topically 4 (four) times daily.   11/09/2017 at 1700  . dorzolamide (TRUSOPT) 2 % ophthalmic solution Place 1 drop into the right eye 2 (two) times daily.   11/09/2017 at 2100  . ferrous sulfate 325 (65 FE) MG EC tablet Take 325 mg by mouth daily with breakfast.   11/10/2017 at 0800  . fluticasone (FLONASE) 50 MCG/ACT nasal  spray Place 1 spray into both nostrils daily.   11/10/2017 at 0800  . Fluticasone-Salmeterol (ADVAIR) 500-50 MCG/DOSE AEPB Inhale 1 puff into the lungs 2 (two) times daily.   11/10/2017 at 0800  . insulin glargine (LANTUS) 100 UNIT/ML injection Inject 0.1 mLs (10 Units total) into the skin at bedtime. 10 mL 11 11/09/2017 at 2100  . isosorbide mononitrate (IMDUR) 30 MG 24 hr tablet Take 90 mg by mouth daily.   11/10/2017 at 0800  . lamoTRIgine (LAMICTAL) 100 MG tablet Take 150 mg by mouth daily.   11/10/2017 at 0800  . Melatonin 10 MG TABS Take 10 mg by mouth at bedtime as needed (for sleeping).   prn at prn  . metoprolol tartrate (LOPRESSOR) 50 MG tablet Take 100 mg by mouth 2 (two) times daily.   11/10/2017 at 0800  . Omega 3 1000 MG CAPS Take 1,000 mg by mouth every evening.   11/09/2017 at 1800  . omega-3 acid ethyl esters (LOVAZA) 1 g capsule Take 1 g by mouth daily.   11/09/2017 at 1800  . omeprazole (PRILOSEC) 20 MG capsule Take 40 mg by mouth 2 (two) times daily before a meal.   11/10/2017 at 0800  . polyethylene glycol (MIRALAX / GLYCOLAX) packet Take 17 g by mouth daily.   11/09/2017 at 1800  . QUEtiapine (SEROQUEL) 25 MG tablet Take 25 mg by mouth 2 (two) times daily.   11/10/2017 at 0600  . QUEtiapine (SEROQUEL) 50 MG tablet Take 50 mg by mouth at bedtime.   11/09/2017 at 2000  . senna (SENOKOT) 8.6 MG TABS tablet Take 2 tablets by mouth at bedtime as needed for mild constipation.   prn at prn  . sertraline (ZOLOFT) 100 MG tablet Take 200 mg by mouth at bedtime.   11/09/2017 at 2000  . sucralfate (CARAFATE) 1 g tablet Take 1 g by mouth 4 (four) times daily.   11/10/2017 at 0800  .  tiotropium (SPIRIVA) 18 MCG inhalation capsule Place 18 mcg into inhaler and inhale daily.   11/09/2017 at 0800   Scheduled:  . allopurinol  100 mg Oral Daily  . ARIPiprazole  20 mg Oral Daily  . aspirin EC  81 mg Oral Daily  . atorvastatin  80 mg Oral Daily  . budesonide (PULMICORT) nebulizer solution  0.25 mg Nebulization BID   . chlorhexidine  15 mL Mouth Rinse BID  . clopidogrel  75 mg Oral Daily  . diclofenac sodium  2 g Topical QID  . dorzolamide  1 drop Right Eye BID  . ferrous sulfate  325 mg Oral Q breakfast  . fluticasone  1 spray Each Nare Daily  . furosemide  40 mg Oral Daily  . insulin aspart  0-9 Units Subcutaneous TID WC  . insulin glargine  15 Units Subcutaneous QHS  . ipratropium-albuterol  3 mL Nebulization Q4H  . isosorbide mononitrate  60 mg Oral Daily  . lamoTRIgine  150 mg Oral Daily  . mouth rinse  15 mL Mouth Rinse q12n4p  . metoprolol tartrate  12.5 mg Oral BID  . omega-3 acid ethyl esters  1,000 mg Oral QPM  . pantoprazole  40 mg Oral BID AC  . polyethylene glycol  17 g Oral Daily  . predniSONE  10 mg Oral Q breakfast  . QUEtiapine  25 mg Oral BID  . sertraline  200 mg Oral QHS  . sucralfate  1 g Oral TID AC & HS  . tiotropium  18 mcg Inhalation Daily   Infusions:  . heparin 1,000 Units/hr (11/13/17 1941)   PRN: acetaminophen, alum & mag hydroxide-simeth, diphenhydrAMINE, docusate sodium, Melatonin, nitroGLYCERIN, senna Anti-infectives (From admission, onward)   None      Assessment: 72 year old female who just got heparin 5000units subq, started on heparin gtt for ACS/Chest pain with rising troponins. Will give half bolus and start on usual heparin rate per protocol    Goal of Therapy:  Heparin level 0.3-0.7 units/ml Monitor platelets by anticoagulation protocol: Yes   Plan:  Give 2000 units bolus x 1 Start heparin infusion at 1250 units/hr Check anti-Xa level in 6 hours and daily while on heparin Continue to monitor H&H and platelets   02/03 @ 0300 HL 0.87 supratherapeutic. Will decrease rate to heparin 1100 units/hr, patient not bleeding per RN, will recheck HL @ 1100. Will check CBC w/ am labs.    2/3 ~ 11:00  HL = 0.8.  Decreased drip rate to 1000 units/hr.  Will recheck HL tonight at 19:00.   11/12/2017 18:51 HL therapeutic x 1. Continue current rate. Will  recheck HL in 8 hours.  02/04 @ 0300 HL 0.59 therapeutic. Will continue current rate and will recheck HL w/ am labs.  02/04 AM heparin level 0.59. Continue current regimen. Recheck heparin level and CBC with tomorrow AM labs.  Erich MontaneMcBane,Dom Haverland S, [facility] Clinical Pharmacist 11/14/2017, 11:54 AM

## 2017-11-14 NOTE — Plan of Care (Signed)
  Progressing Clinical Measurements: Diagnostic test results will improve 11/14/2017 0309 - Progressing by Stefan Churchogers, Lilith Solana M, RN Cardiovascular complication will be avoided 11/14/2017 0309 - Progressing by Stefan Churchogers, Brianna Bennett M, RN

## 2017-11-14 NOTE — Discharge Summary (Signed)
[facility] - [place] at [facility]   PATIENT NAME: Cathy Guzman    MR#:  [id number]  DATE OF BIRTH:  02/22/1946  DATE OF ADMISSION:  11/10/2017 ADMITTING PHYSICIAN: Altamese DillingVaibhavkumar Fin Hupp, MD  DATE OF DISCHARGE: 11/14/2017  PRIMARY CARE PHYSICIAN: Dorothey BasemanBronstein, David, MD    ADMISSION DIAGNOSIS:  COPD exacerbation (HCC) [J44.1] Acute congestive heart failure, unspecified heart failure type (HCC) [I50.9]  DISCHARGE DIAGNOSIS:  Principal Problem:   Acute on chronic respiratory failure with hypoxia (HCC) Active Problems:   COPD with acute exacerbation (HCC)   Acute on chronic respiratory failure (HCC)   DNR (do not resuscitate)   Palliative care by specialist   Stage 4 chronic kidney disease (HCC)   Acute congestive heart failure (HCC)   SECONDARY DIAGNOSIS:   Past Medical History:  Diagnosis Date  . Acute myocardial infarction, subendocardial infarction, initial episode of care (HCC)   . Anginal syndrome (HCC)   . Atopic rhinitis   . Avitaminosis D   . Bacteremia   . Better eye: severe vision impairment; lesser eye: blind   . Bipolar disorder, unspecified (HCC)   . Cannot walk   . CHF (congestive heart failure) (HCC)   . CN (constipation)   . Coronary atherosclerosis of native coronary artery   . Encounter for vocational therapy   . Esophageal reflux   . Essential hypertension, malignant   . Gout, unspecified   . Heart failure, diastolic (HCC)   . Hyperlipemia   . Hypomagnesemia   . Hypopotassemia   . Iron deficiency anemia, unspecified   . Muscle weakness (generalized)   . Obstructive chronic bronchitis without exacerbation (HCC)   . Oropharyngeal dysphagia   . Persistent disorder of initiating or maintaining sleep   . Rash and other nonspecific skin eruption   . Scar painful   . Scissor gait   . Shortness of breath   . Spasm of muscle   . Type II diabetes mellitus with renal manifestations (HCC)   . Urinary tract infection, site not  specified     HOSPITAL COURSE:   *Acute on chronic respiratory failure with hypoxia This is secondary to COPD and CHF  on BiPAP on admission, treat underlying reasons and monitor in stepdown.  now on nasal cannula oxygen, improved.  * Non-ST elevation MI   Heparin IV drip, cardiology consult.   Cardiologist suggested to continue heparin for 24 hours more and then stop and continue medical management.  * Ac COPD exacerbation IV and inhaled steroids. Nebulizer treatments. Currently no fever or sputum production, calcitonin is not high, does not need antibiotic.  *Acute on chronic diastolic congestive heart failure Her Lasix and metolazone Stopped in last admission Slight pulmonary edema on x-ray, no edema on legs.  responded to IV Lasix and BiPAP, now on oral Lasix once a day and nasal cannula oxygen.  Due to Poor func status and NSTEMI, not a candidate for Cardiac rehab and CHF rehab program.  *Chronic renal failure stage 4 Monitor in hospital,    we may need to reevaluate need of Lasix on discharge.   May not be a good candidate for cardiac catheterization due to this.  *History of coronary artery disease Continue aspirin, Plavix, atorvastatin  metoprolol started with small dose.Marland Kitchen.   Also on imdur.    Still cont to have intermittent chest pain, Encouraged to use Nitro as needed  *Hypertension  resume isosorbide.   Small dose metoprolol.  *Diabetes Continue Lantus, keep on sliding scale coverage.  *  Dysphagia / regurgitation   Diet Recommendation  Dysphagia level 2 (minced foods, moistened); thin liquids. General aspiration precautions. REFLUX precautions.   Medication Administration: Whole meds with puree(for easier clearing)      DISCHARGE CONDITIONS:   Stable.  CONSULTS OBTAINED:  Treatment Team:  Marcina Millard, MD  DRUG ALLERGIES:   Allergies  Allergen Reactions  . Ace Inhibitors     unknown  . Bactrim  [Sulfamethoxazole-Trimethoprim] Itching  . Doxycycline Hyclate     unknown  . Hydrocodone     unknown  . Oxycodone     unknown  . Sulfanilamide     unknown    DISCHARGE MEDICATIONS:   Allergies as of 11/14/2017      Reactions   Ace Inhibitors    unknown   Bactrim [sulfamethoxazole-trimethoprim] Itching   Doxycycline Hyclate    unknown   Hydrocodone    unknown   Oxycodone    unknown   Sulfanilamide    unknown      Medication List    STOP taking these medications   amLODipine 10 MG tablet Commonly known as:  NORVASC     TAKE these medications   albuterol 108 (90 Base) MCG/ACT inhaler Commonly known as:  PROVENTIL HFA;VENTOLIN HFA Inhale 2 puffs into the lungs every 3 (three) hours as needed for wheezing or shortness of breath.   allopurinol 100 MG tablet Commonly known as:  ZYLOPRIM Take 100 mg by mouth daily.   alum & mag hydroxide-simeth 200-200-20 MG/5ML suspension Commonly known as:  MAALOX/MYLANTA Take by mouth every 4 (four) hours as needed for indigestion or heartburn.   ARIPiprazole 20 MG tablet Commonly known as:  ABILIFY Take 20 mg by mouth daily.   aspirin EC 81 MG tablet Take 81 mg by mouth daily.   atorvastatin 80 MG tablet Commonly known as:  LIPITOR Take 80 mg by mouth daily.   budesonide 0.25 MG/2ML nebulizer solution Commonly known as:  PULMICORT Take 2 mLs (0.25 mg total) by nebulization 2 (two) times daily.   clopidogrel 75 MG tablet Commonly known as:  PLAVIX Take 75 mg by mouth daily.   diclofenac sodium 1 % Gel Commonly known as:  VOLTAREN Apply 2 g topically 4 (four) times daily.   dorzolamide 2 % ophthalmic solution Commonly known as:  TRUSOPT Place 1 drop into the right eye 2 (two) times daily.   ferrous sulfate 325 (65 FE) MG EC tablet Take 325 mg by mouth daily with breakfast.   fluticasone 50 MCG/ACT nasal spray Commonly known as:  FLONASE Place 1 spray into both nostrils daily.   Fluticasone-Salmeterol 500-50  MCG/DOSE Aepb Commonly known as:  ADVAIR Inhale 1 puff into the lungs 2 (two) times daily.   furosemide 40 MG tablet Commonly known as:  LASIX Take 1 tablet (40 mg total) by mouth daily. Start taking on:  11/15/2017   insulin glargine 100 UNIT/ML injection Commonly known as:  LANTUS Inject 0.1 mLs (10 Units total) into the skin at bedtime.   ipratropium-albuterol 0.5-2.5 (3) MG/3ML Soln Commonly known as:  DUONEB Take 3 mLs by nebulization every 6 (six) hours.   isosorbide mononitrate 60 MG 24 hr tablet Commonly known as:  IMDUR Take 1 tablet (60 mg total) by mouth daily. Start taking on:  11/15/2017 What changed:    medication strength  how much to take   lamoTRIgine 100 MG tablet Commonly known as:  LAMICTAL Take 150 mg by mouth daily.   Melatonin 10 MG Tabs Take 10  mg by mouth at bedtime as needed (for sleeping).   metoprolol tartrate 25 MG tablet Commonly known as:  LOPRESSOR Take 0.5 tablets (12.5 mg total) by mouth 2 (two) times daily. What changed:    medication strength  how much to take   nitroGLYCERIN 0.4 MG SL tablet Commonly known as:  NITROSTAT Place 1 tablet (0.4 mg total) under the tongue every 5 (five) minutes as needed for chest pain.   Omega 3 1000 MG Caps Take 1,000 mg by mouth every evening.   omega-3 acid ethyl esters 1 g capsule Commonly known as:  LOVAZA Take 1 g by mouth daily.   omeprazole 20 MG capsule Commonly known as:  PRILOSEC Take 40 mg by mouth 2 (two) times daily before a meal.   polyethylene glycol packet Commonly known as:  MIRALAX / GLYCOLAX Take 17 g by mouth daily.   QUEtiapine 50 MG tablet Commonly known as:  SEROQUEL Take 50 mg by mouth at bedtime.   QUEtiapine 25 MG tablet Commonly known as:  SEROQUEL Take 25 mg by mouth 2 (two) times daily.   senna 8.6 MG Tabs tablet Commonly known as:  SENOKOT Take 2 tablets by mouth at bedtime as needed for mild constipation.   sertraline 100 MG tablet Commonly known  as:  ZOLOFT Take 200 mg by mouth at bedtime.   sucralfate 1 g tablet Commonly known as:  CARAFATE Take 1 g by mouth 4 (four) times daily.   tiotropium 18 MCG inhalation capsule Commonly known as:  SPIRIVA Place 18 mcg into inhaler and inhale daily.        DISCHARGE INSTRUCTIONS:    Diet Recommendation  Dysphagia level 2 (minced foods, moistened); thin liquids. General aspiration precautions. REFLUX precautions.   Medication Administration: Whole meds with puree(for easier clearing)     Palliative care nurse to follow at SNF.   If you experience worsening of your admission symptoms, develop shortness of breath, life threatening emergency, suicidal or homicidal thoughts you must seek medical attention immediately by calling 911 or calling your MD immediately  if symptoms less severe.  You Must read complete instructions/literature along with all the possible adverse reactions/side effects for all the Medicines you take and that have been prescribed to you. Take any new Medicines after you have completely understood and accept all the possible adverse reactions/side effects.   Please note  You were cared for by a hospitalist during your hospital stay. If you have any questions about your discharge medications or the care you received while you were in the hospital after you are discharged, you can call the unit and asked to speak with the hospitalist on call if the hospitalist that took care of you is not available. Once you are discharged, your primary care physician will handle any further medical issues. Please note that NO REFILLS for any discharge medications will be authorized once you are discharged, as it is imperative that you return to your primary care physician (or establish a relationship with a primary care physician if you do not have one) for your aftercare needs so that they can reassess your need for medications and monitor your lab values.    Today   CHIEF  COMPLAINT:   Chief Complaint  Patient presents with  . Shortness of Breath    HISTORY OF PRESENT ILLNESS:  Cathy Guzman  is a 72 y.o. female with a known history of acute myocardial infarction, bipolar disorder, diastolic congestive heart failure, essential hypertension, gout, hyperlipidemia,  iron deficiency anemia, COPD on chronic home oxygen use, type 2 diabetes- has repeated admissions in last 1 month- initially at [facility] and last week admitted here with hyponatremia secondary to multiple diuretics use, which improved after holding the diuretics and she was sent to rehabilitation 2 days ago without diuretics prescriptions. She also had acute on chronic renal failure and was advised to follow with nephrology clinic in one week. At baseline she is on 2 L oxygen via nasal cannula. Since yesterday at rehabilitation she is more short of breath, requiring up to 4 L oxygen and having tachypnea so today to send her to emergency room. She was noted to have some pulmonary edema and was having increased work of breathing so started on BiPAP by ER physician. She denies drinking excessive liquids, her total fluid intake in a day is around 4 small cups of liquids.    VITAL SIGNS:  Blood pressure 114/66, pulse 94, temperature 98.4 F (36.9 C), temperature source Oral, resp. rate 17, height 5\' 8"  (1.727 m), weight 116.1 kg (255 lb 14.4 oz), SpO2 94 %.  I/O:    Intake/Output Summary (Last 24 hours) at 11/14/2017 1149 Last data filed at 11/14/2017 1016 Gross per 24 hour  Intake 360 ml  Output 2000 ml  Net -1640 ml    PHYSICAL EXAMINATION:   GENERAL:72 y.o.-year-old patient lying in the bed with acute distress.  EYES: Pupils equal, round, reactive to light and accommodation. No scleral icterus. Extraocular muscles intact.  HEENT: Head atraumatic, normocephalic. Oropharynx and nasopharynx clear. Nasal packing in place. NECK: Supple, no jugular venous distention. No thyroid enlargement, no tenderness.   LUNGS: Normal breath sounds bilaterally,some bilateralwheezing, somecrepitation. Positiveuse of accessory muscles of respiration. On nasal cannula oxygen now.Marland Kitchen CARDIOVASCULAR: S1, S2 normal. No murmurs, rubs, or gallops.  ABDOMEN: Soft, nontender, nondistended. Bowel sounds present. No organomegaly or mass.  EXTREMITIES: No pedal edema, cyanosis, or clubbing.  NEUROLOGIC: Cranial nerves II through XII are intact. Muscle strength3-4/5 in all extremities. Sensation intact. Gait not checked.  PSYCHIATRIC: The patient is alert and oriented x 3.  SKIN: No obvious rash, lesion, or ulcer.      DATA REVIEW:   CBC Recent Labs  Lab 11/14/17 0451  WBC 5.7  HGB 8.1*  HCT 24.3*  PLT 211    Chemistries  Recent Labs  Lab 11/12/17 0608  11/14/17 0451  NA 129*   < > 137  K 4.7   < > 4.1  CL 85*   < > 91*  CO2 31   < > 34*  GLUCOSE 202*   < > 126*  BUN 60*   < > 64*  CREATININE 2.59*   < > 2.71*  CALCIUM 9.3   < > 9.8  MG 3.2*  --   --    < > = values in this interval not displayed.    Cardiac Enzymes Recent Labs  Lab 11/12/17 0608  TROPONINI 3.14*    Microbiology Results  Results for orders placed or performed during the hospital encounter of 11/10/17  MRSA PCR Screening     Status: None   Collection Time: 11/10/17  2:01 PM  Result Value Ref Range Status   MRSA by PCR NEGATIVE NEGATIVE Final    Comment:        The GeneXpert MRSA Assay (FDA approved for NASAL specimens only), is one component of a comprehensive MRSA colonization surveillance program. It is not intended to diagnose MRSA infection nor to guide or monitor treatment  for MRSA infections. Performed at [facility] [address] [place], [place] [address]     RADIOLOGY:  No results found.  EKG:   Orders placed or performed during the hospital encounter of 11/10/17  . ED EKG  . ED EKG  . EKG 12-Lead  . EKG 12-Lead  . EKG 12-Lead  . EKG 12-Lead      Management plans  discussed with the patient, family and they are in agreement.  CODE STATUS:     Code Status Orders  (From admission, onward)        Start     Ordered   11/10/17 1221  Do not attempt resuscitation (DNR)  Continuous    Question Answer Comment  In the event of cardiac or respiratory ARREST Do not call a "code blue"   In the event of cardiac or respiratory ARREST Do not perform Intubation, CPR, defibrillation or ACLS   In the event of cardiac or respiratory ARREST Use medication by any route, position, wound care, and other measures to relive pain and suffering. May use oxygen, suction and manual treatment of airway obstruction as needed for comfort.   Comments confirmed with pt.      11/10/17 1220    Code Status History    Date Active Date Inactive Code Status Order ID Comments User Context   11/10/2017 10:54 11/10/2017 12:20 DNR [id number]  Altamese Dilling, MD ED   11/04/2017 01:34 11/08/2017 13:33 Full Code [id number]  Oralia Manis, MD Inpatient    Advance Directive Documentation     Most Recent Value  Type of Advance Directive  Healthcare Power of Attorney  Pre-existing out of facility DNR order (yellow form or pink MOST form)  No data  "MOST" Form in Place?  No data      TOTAL TIME TAKING CARE OF THIS PATIENT: 35 minutes.    Altamese Dilling M.D on 11/14/2017 at 11:49 AM  Between 7am to 6pm - Pager - [phone]  After 6pm go to [email or web address] - password [email or web address]  [facility]  Office  [phone]  CC: Primary care physician; Dorothey Baseman, MD   Note: This dictation was prepared with Dragon dictation along with smaller phrase technology. Any transcriptional errors that result from this process are unintentional.

## 2017-11-16 ENCOUNTER — Other Ambulatory Visit: Payer: Self-pay

## 2017-11-16 ENCOUNTER — Inpatient Hospital Stay
Admission: EM | Admit: 2017-11-16 | Discharge: 2017-11-17 | DRG: 291 | Disposition: A | Discharge status: Hospice | Payer: Medicare Other | Attending: Internal Medicine | Admitting: Internal Medicine

## 2017-11-16 ENCOUNTER — Emergency Department: Payer: Medicare Other

## 2017-11-16 DIAGNOSIS — I130 Hypertensive heart and chronic kidney disease with heart failure and stage 1 through stage 4 chronic kidney disease, or unspecified chronic kidney disease: Secondary | ICD-10-CM | POA: Diagnosis present

## 2017-11-16 DIAGNOSIS — I2510 Atherosclerotic heart disease of native coronary artery without angina pectoris: Secondary | ICD-10-CM | POA: Diagnosis present

## 2017-11-16 DIAGNOSIS — J9601 Acute respiratory failure with hypoxia: Secondary | ICD-10-CM

## 2017-11-16 DIAGNOSIS — Z885 Allergy status to narcotic agent status: Secondary | ICD-10-CM

## 2017-11-16 DIAGNOSIS — L89151 Pressure ulcer of sacral region, stage 1: Secondary | ICD-10-CM | POA: Diagnosis present

## 2017-11-16 DIAGNOSIS — Z8249 Family history of ischemic heart disease and other diseases of the circulatory system: Secondary | ICD-10-CM | POA: Diagnosis not present

## 2017-11-16 DIAGNOSIS — Z794 Long term (current) use of insulin: Secondary | ICD-10-CM

## 2017-11-16 DIAGNOSIS — R609 Edema, unspecified: Secondary | ICD-10-CM | POA: Diagnosis present

## 2017-11-16 DIAGNOSIS — Z9884 Bariatric surgery status: Secondary | ICD-10-CM

## 2017-11-16 DIAGNOSIS — K219 Gastro-esophageal reflux disease without esophagitis: Secondary | ICD-10-CM | POA: Diagnosis present

## 2017-11-16 DIAGNOSIS — I5033 Acute on chronic diastolic (congestive) heart failure: Secondary | ICD-10-CM | POA: Diagnosis present

## 2017-11-16 DIAGNOSIS — Z7189 Other specified counseling: Secondary | ICD-10-CM | POA: Diagnosis not present

## 2017-11-16 DIAGNOSIS — R748 Abnormal levels of other serum enzymes: Secondary | ICD-10-CM | POA: Diagnosis not present

## 2017-11-16 DIAGNOSIS — E1122 Type 2 diabetes mellitus with diabetic chronic kidney disease: Secondary | ICD-10-CM | POA: Diagnosis present

## 2017-11-16 DIAGNOSIS — N189 Chronic kidney disease, unspecified: Secondary | ICD-10-CM

## 2017-11-16 DIAGNOSIS — Z888 Allergy status to other drugs, medicaments and biological substances status: Secondary | ICD-10-CM

## 2017-11-16 DIAGNOSIS — I509 Heart failure, unspecified: Secondary | ICD-10-CM | POA: Diagnosis not present

## 2017-11-16 DIAGNOSIS — J9621 Acute and chronic respiratory failure with hypoxia: Secondary | ICD-10-CM | POA: Diagnosis present

## 2017-11-16 DIAGNOSIS — Z7951 Long term (current) use of inhaled steroids: Secondary | ICD-10-CM

## 2017-11-16 DIAGNOSIS — Z7902 Long term (current) use of antithrombotics/antiplatelets: Secondary | ICD-10-CM | POA: Diagnosis not present

## 2017-11-16 DIAGNOSIS — J449 Chronic obstructive pulmonary disease, unspecified: Secondary | ICD-10-CM | POA: Diagnosis present

## 2017-11-16 DIAGNOSIS — E785 Hyperlipidemia, unspecified: Secondary | ICD-10-CM | POA: Diagnosis present

## 2017-11-16 DIAGNOSIS — Z90710 Acquired absence of both cervix and uterus: Secondary | ICD-10-CM | POA: Diagnosis not present

## 2017-11-16 DIAGNOSIS — Z881 Allergy status to other antibiotic agents status: Secondary | ICD-10-CM

## 2017-11-16 DIAGNOSIS — R7989 Other specified abnormal findings of blood chemistry: Secondary | ICD-10-CM

## 2017-11-16 DIAGNOSIS — D72829 Elevated white blood cell count, unspecified: Secondary | ICD-10-CM | POA: Diagnosis present

## 2017-11-16 DIAGNOSIS — F319 Bipolar disorder, unspecified: Secondary | ICD-10-CM | POA: Diagnosis present

## 2017-11-16 DIAGNOSIS — E874 Mixed disorder of acid-base balance: Secondary | ICD-10-CM | POA: Diagnosis present

## 2017-11-16 DIAGNOSIS — Z882 Allergy status to sulfonamides status: Secondary | ICD-10-CM

## 2017-11-16 DIAGNOSIS — D509 Iron deficiency anemia, unspecified: Secondary | ICD-10-CM | POA: Diagnosis present

## 2017-11-16 DIAGNOSIS — Z515 Encounter for palliative care: Secondary | ICD-10-CM

## 2017-11-16 DIAGNOSIS — Z7982 Long term (current) use of aspirin: Secondary | ICD-10-CM

## 2017-11-16 DIAGNOSIS — J9622 Acute and chronic respiratory failure with hypercapnia: Secondary | ICD-10-CM | POA: Diagnosis not present

## 2017-11-16 DIAGNOSIS — Z66 Do not resuscitate: Secondary | ICD-10-CM | POA: Diagnosis present

## 2017-11-16 DIAGNOSIS — N184 Chronic kidney disease, stage 4 (severe): Secondary | ICD-10-CM | POA: Diagnosis present

## 2017-11-16 DIAGNOSIS — L8990 Pressure ulcer of unspecified site, unspecified stage: Secondary | ICD-10-CM

## 2017-11-16 DIAGNOSIS — M109 Gout, unspecified: Secondary | ICD-10-CM | POA: Diagnosis present

## 2017-11-16 DIAGNOSIS — H5462 Unqualified visual loss, left eye, normal vision right eye: Secondary | ICD-10-CM | POA: Diagnosis present

## 2017-11-16 DIAGNOSIS — I252 Old myocardial infarction: Secondary | ICD-10-CM

## 2017-11-16 DIAGNOSIS — R778 Other specified abnormalities of plasma proteins: Secondary | ICD-10-CM

## 2017-11-16 LAB — URINALYSIS, COMPLETE (UACMP) WITH MICROSCOPIC
Bilirubin Urine: NEGATIVE
Glucose, UA: NEGATIVE mg/dL
Ketones, ur: NEGATIVE mg/dL
Nitrite: NEGATIVE
PROTEIN: NEGATIVE mg/dL
Specific Gravity, Urine: 1.005 (ref 1.005–1.030)
pH: 7 (ref 5.0–8.0)

## 2017-11-16 LAB — CBC WITH DIFFERENTIAL/PLATELET
Basophils Absolute: 0.1 10*3/uL (ref 0–0.1)
Basophils Relative: 0 %
EOS ABS: 0.1 10*3/uL (ref 0–0.7)
Eosinophils Relative: 1 %
HEMATOCRIT: 28 % — AB (ref 35.0–47.0)
HEMOGLOBIN: 8.7 g/dL — AB (ref 12.0–16.0)
LYMPHS ABS: 0.4 10*3/uL — AB (ref 1.0–3.6)
Lymphocytes Relative: 3 %
MCH: 28.4 pg (ref 26.0–34.0)
MCHC: 31.3 g/dL — AB (ref 32.0–36.0)
MCV: 90.7 fL (ref 80.0–100.0)
MONOS PCT: 5 %
Monocytes Absolute: 0.6 10*3/uL (ref 0.2–0.9)
NEUTROS ABS: 12.3 10*3/uL — AB (ref 1.4–6.5)
NEUTROS PCT: 91 %
Platelets: 227 10*3/uL (ref 150–440)
RBC: 3.08 MIL/uL — ABNORMAL LOW (ref 3.80–5.20)
RDW: 15.8 % — ABNORMAL HIGH (ref 11.5–14.5)
WBC: 13.5 10*3/uL — ABNORMAL HIGH (ref 3.6–11.0)

## 2017-11-16 LAB — COMPREHENSIVE METABOLIC PANEL
ALK PHOS: 87 U/L (ref 38–126)
ALT: 16 U/L (ref 14–54)
AST: 23 U/L (ref 15–41)
Albumin: 3.3 g/dL — ABNORMAL LOW (ref 3.5–5.0)
Anion gap: 18 — ABNORMAL HIGH (ref 5–15)
BUN: 53 mg/dL — ABNORMAL HIGH (ref 6–20)
CO2: 31 mmol/L (ref 22–32)
Calcium: 10.4 mg/dL — ABNORMAL HIGH (ref 8.9–10.3)
Chloride: 91 mmol/L — ABNORMAL LOW (ref 101–111)
Creatinine, Ser: 2.47 mg/dL — ABNORMAL HIGH (ref 0.44–1.00)
GFR calc non Af Amer: 18 mL/min — ABNORMAL LOW (ref >60)
GFR, EST AFRICAN AMERICAN: 21 mL/min — AB (ref >60)
Glucose, Bld: 228 mg/dL — ABNORMAL HIGH (ref 65–99)
Potassium: 4.1 mmol/L (ref 3.5–5.1)
SODIUM: 140 mmol/L (ref 135–145)
Total Bilirubin: 1.3 mg/dL — ABNORMAL HIGH (ref 0.3–1.2)
Total Protein: 6.6 g/dL (ref 6.5–8.1)

## 2017-11-16 LAB — MRSA PCR SCREENING: MRSA by PCR: NEGATIVE

## 2017-11-16 LAB — HEMOGLOBIN A1C
Hgb A1c MFr Bld: 6.7 % — ABNORMAL HIGH (ref 4.8–5.6)
Mean Plasma Glucose: 145.59 mg/dL

## 2017-11-16 LAB — TROPONIN I: Troponin I: 0.61 ng/mL (ref <0.03)

## 2017-11-16 LAB — BLOOD GAS, ARTERIAL
Acid-Base Excess: 9.7 mmol/L — ABNORMAL HIGH (ref 0.0–2.0)
Bicarbonate: 35.2 mmol/L — ABNORMAL HIGH (ref 20.0–28.0)
DELIVERY SYSTEMS: POSITIVE
Expiratory PAP: 6
FIO2: 0.4
Inspiratory PAP: 16
O2 Saturation: 93.8 %
PCO2 ART: 53 mmHg — AB (ref 32.0–48.0)
PH ART: 7.43 (ref 7.350–7.450)
Patient temperature: 37
pO2, Arterial: 68 mmHg — ABNORMAL LOW (ref 83.0–108.0)

## 2017-11-16 LAB — CK: CK TOTAL: 20 U/L — AB (ref 38–234)

## 2017-11-16 LAB — BRAIN NATRIURETIC PEPTIDE: B NATRIURETIC PEPTIDE 5: 995 pg/mL — AB (ref 0.0–100.0)

## 2017-11-16 LAB — LIPASE, BLOOD: Lipase: 23 U/L (ref 11–51)

## 2017-11-16 LAB — TSH: TSH: 3.624 u[IU]/mL (ref 0.350–4.500)

## 2017-11-16 LAB — GLUCOSE, CAPILLARY
GLUCOSE-CAPILLARY: 149 mg/dL — AB (ref 65–99)
Glucose-Capillary: 142 mg/dL — ABNORMAL HIGH (ref 65–99)

## 2017-11-16 LAB — LACTIC ACID, PLASMA: Lactic Acid, Venous: 1.4 mmol/L (ref 0.5–1.9)

## 2017-11-16 MED ORDER — BUDESONIDE 0.25 MG/2ML IN SUSP
0.2500 mg | Freq: Two times a day (BID) | RESPIRATORY_TRACT | Status: DC
  Administered 2017-11-16 – 2017-11-17 (×3): 0.25 mg via RESPIRATORY_TRACT
  Filled 2017-11-16 (×2): qty 2

## 2017-11-16 MED ORDER — SERTRALINE HCL 50 MG PO TABS: 200.0000 mg | ORAL_TABLET | Freq: Every day | ORAL | Status: DC

## 2017-11-16 MED ORDER — INSULIN GLARGINE 100 UNIT/ML ~~LOC~~ SOLN
6.0000 [IU] | Freq: Every day | SUBCUTANEOUS | Status: DC
  Filled 2017-11-16 (×2): qty 0.06

## 2017-11-16 MED ORDER — ONDANSETRON HCL 4 MG/2ML IJ SOLN: 4.0000 mg | Freq: Four times a day (QID) | INTRAMUSCULAR | Status: DC | PRN

## 2017-11-16 MED ORDER — DICLOFENAC SODIUM 1 % TD GEL
2.0000 g | Freq: Four times a day (QID) | TRANSDERMAL | Status: DC
  Administered 2017-11-17: 2 g via TOPICAL
  Filled 2017-11-16: qty 100

## 2017-11-16 MED ORDER — METOPROLOL TARTRATE 25 MG PO TABS
12.5000 mg | ORAL_TABLET | Freq: Two times a day (BID) | ORAL | Status: DC
  Administered 2017-11-17: 12.5 mg via ORAL
  Filled 2017-11-16: qty 1

## 2017-11-16 MED ORDER — CLOPIDOGREL BISULFATE 75 MG PO TABS
75.0000 mg | ORAL_TABLET | Freq: Every day | ORAL | Status: DC
  Administered 2017-11-17: 75 mg via ORAL
  Filled 2017-11-16: qty 1

## 2017-11-16 MED ORDER — IPRATROPIUM-ALBUTEROL 0.5-2.5 (3) MG/3ML IN SOLN
3.0000 mL | Freq: Four times a day (QID) | RESPIRATORY_TRACT | Status: DC
  Administered 2017-11-16 – 2017-11-17 (×5): 3 mL via RESPIRATORY_TRACT
  Filled 2017-11-16 (×4): qty 3

## 2017-11-16 MED ORDER — ISOSORBIDE MONONITRATE ER 30 MG PO TB24
60.0000 mg | ORAL_TABLET | Freq: Every day | ORAL | Status: DC
  Administered 2017-11-17: 60 mg via ORAL
  Filled 2017-11-16: qty 2

## 2017-11-16 MED ORDER — OMEGA-3-ACID ETHYL ESTERS 1 G PO CAPS: 1.0000 g | ORAL_CAPSULE | Freq: Every day | ORAL | Status: DC

## 2017-11-16 MED ORDER — ASPIRIN EC 81 MG PO TBEC
81.0000 mg | DELAYED_RELEASE_TABLET | Freq: Every day | ORAL | Status: DC
  Filled 2017-11-16: qty 1

## 2017-11-16 MED ORDER — ATORVASTATIN CALCIUM 20 MG PO TABS
80.0000 mg | ORAL_TABLET | Freq: Every day | ORAL | Status: DC
  Administered 2017-11-17: 80 mg via ORAL
  Filled 2017-11-16: qty 4

## 2017-11-16 MED ORDER — TIOTROPIUM BROMIDE MONOHYDRATE 18 MCG IN CAPS
18.0000 ug | ORAL_CAPSULE | Freq: Every day | RESPIRATORY_TRACT | Status: DC
  Administered 2017-11-17: 18 ug via RESPIRATORY_TRACT
  Filled 2017-11-16: qty 5

## 2017-11-16 MED ORDER — POLYETHYLENE GLYCOL 3350 17 G PO PACK
17.0000 g | PACK | Freq: Every day | ORAL | Status: DC
  Administered 2017-11-17: 17 g via ORAL
  Filled 2017-11-16: qty 1

## 2017-11-16 MED ORDER — ACETAMINOPHEN 325 MG PO TABS: 650.0000 mg | ORAL_TABLET | Freq: Four times a day (QID) | ORAL | Status: DC | PRN

## 2017-11-16 MED ORDER — ACETAMINOPHEN 650 MG RE SUPP: 650.0000 mg | Freq: Four times a day (QID) | RECTAL | Status: DC | PRN

## 2017-11-16 MED ORDER — ALUM & MAG HYDROXIDE-SIMETH 200-200-20 MG/5ML PO SUSP
30.0000 mL | ORAL | Status: DC | PRN
  Filled 2017-11-16: qty 30

## 2017-11-16 MED ORDER — MELATONIN 5 MG PO TABS
10.0000 mg | ORAL_TABLET | Freq: Every evening | ORAL | Status: DC | PRN
  Filled 2017-11-16 (×2): qty 2

## 2017-11-16 MED ORDER — ARIPIPRAZOLE 10 MG PO TABS
20.0000 mg | ORAL_TABLET | Freq: Every day | ORAL | Status: DC
  Administered 2017-11-17: 20 mg via ORAL
  Filled 2017-11-16 (×2): qty 2

## 2017-11-16 MED ORDER — FUROSEMIDE 20 MG PO TABS
40.0000 mg | ORAL_TABLET | Freq: Every day | ORAL | Status: DC
  Administered 2017-11-17: 40 mg via ORAL
  Filled 2017-11-16: qty 2

## 2017-11-16 MED ORDER — SUCRALFATE 1 G PO TABS
1.0000 g | ORAL_TABLET | Freq: Four times a day (QID) | ORAL | Status: DC
  Administered 2017-11-17 (×2): 1 g via ORAL
  Filled 2017-11-16: qty 1

## 2017-11-16 MED ORDER — NITROGLYCERIN 0.4 MG SL SUBL: 0.4000 mg | SUBLINGUAL_TABLET | SUBLINGUAL | Status: DC | PRN

## 2017-11-16 MED ORDER — OMEGA-3-ACID ETHYL ESTERS 1 G PO CAPS
1000.0000 mg | ORAL_CAPSULE | Freq: Every evening | ORAL | Status: DC
  Filled 2017-11-16 (×2): qty 1

## 2017-11-16 MED ORDER — DORZOLAMIDE HCL 2 % OP SOLN
1.0000 [drp] | Freq: Two times a day (BID) | OPHTHALMIC | Status: DC
  Administered 2017-11-17: 1 [drp] via OPHTHALMIC
  Filled 2017-11-16: qty 10

## 2017-11-16 MED ORDER — ALLOPURINOL 100 MG PO TABS
100.0000 mg | ORAL_TABLET | Freq: Every day | ORAL | Status: DC
  Administered 2017-11-17: 100 mg via ORAL
  Filled 2017-11-16 (×2): qty 1

## 2017-11-16 MED ORDER — FLUTICASONE PROPIONATE 50 MCG/ACT NA SUSP
1.0000 | Freq: Every day | NASAL | Status: DC
  Filled 2017-11-16: qty 16

## 2017-11-16 MED ORDER — INSULIN ASPART 100 UNIT/ML ~~LOC~~ SOLN
0.0000 [IU] | Freq: Three times a day (TID) | SUBCUTANEOUS | Status: DC
  Administered 2017-11-16 – 2017-11-17 (×2): 2 [IU] via SUBCUTANEOUS
  Filled 2017-11-16 (×2): qty 1

## 2017-11-16 MED ORDER — DOCUSATE SODIUM 100 MG PO CAPS
100.0000 mg | ORAL_CAPSULE | Freq: Two times a day (BID) | ORAL | Status: DC
  Administered 2017-11-17: 100 mg via ORAL
  Filled 2017-11-16: qty 1

## 2017-11-16 MED ORDER — LAMOTRIGINE 25 MG PO TABS
150.0000 mg | ORAL_TABLET | Freq: Every day | ORAL | Status: DC
  Administered 2017-11-17: 150 mg via ORAL
  Filled 2017-11-16: qty 6

## 2017-11-16 MED ORDER — MORPHINE SULFATE (PF) 2 MG/ML IV SOLN
1.0000 mg | INTRAVENOUS | Status: DC | PRN
  Administered 2017-11-16 – 2017-11-17 (×3): 2 mg via INTRAVENOUS
  Filled 2017-11-16 (×3): qty 1

## 2017-11-16 MED ORDER — ONDANSETRON HCL 4 MG PO TABS: 4.0000 mg | ORAL_TABLET | Freq: Four times a day (QID) | ORAL | Status: DC | PRN

## 2017-11-16 MED ORDER — PANTOPRAZOLE SODIUM 40 MG PO TBEC
40.0000 mg | DELAYED_RELEASE_TABLET | Freq: Every day | ORAL | Status: DC
  Administered 2017-11-17: 40 mg via ORAL
  Filled 2017-11-16: qty 1

## 2017-11-16 MED ORDER — CHLORHEXIDINE GLUCONATE 0.12 % MT SOLN: 15.0000 mL | Freq: Two times a day (BID) | OROMUCOSAL | Status: DC

## 2017-11-16 MED ORDER — FUROSEMIDE 10 MG/ML IJ SOLN
40.0000 mg | Freq: Once | INTRAMUSCULAR | Status: AC
  Administered 2017-11-16: 40 mg via INTRAVENOUS
  Filled 2017-11-16: qty 4

## 2017-11-16 MED ORDER — QUETIAPINE FUMARATE 25 MG PO TABS
25.0000 mg | ORAL_TABLET | Freq: Two times a day (BID) | ORAL | Status: DC
  Administered 2017-11-17: 25 mg via ORAL
  Filled 2017-11-16: qty 1

## 2017-11-16 MED ORDER — ORAL CARE MOUTH RINSE
15.0000 mL | Freq: Two times a day (BID) | OROMUCOSAL | Status: DC
  Administered 2017-11-16: 15 mL via OROMUCOSAL

## 2017-11-16 MED ORDER — QUETIAPINE FUMARATE 25 MG PO TABS: 50.0000 mg | ORAL_TABLET | Freq: Every day | ORAL | Status: DC

## 2017-11-16 MED ORDER — FERROUS SULFATE 325 (65 FE) MG PO TABS
325.0000 mg | ORAL_TABLET | Freq: Every day | ORAL | Status: DC
  Administered 2017-11-17: 325 mg via ORAL
  Filled 2017-11-16 (×2): qty 1

## 2017-11-16 MED ORDER — SENNA 8.6 MG PO TABS: 2.0000 | ORAL_TABLET | Freq: Every evening | ORAL | Status: DC | PRN

## 2017-11-16 NOTE — ED Triage Notes (Addendum)
Per EMS, pt from [facility] with CP and increased SHOB. Pt on 2L chronic and was 84% when EMS arrived. Pt placed on CPAP and oxygen came up to 94%. Pt has rales bilaterally. Pt being placed on bipap at this time. EMS also reports pt was given 3 nitros and duoneb and aspirin at facility. Pt tachycardic on arrival. Pt able to answer questions at this time.

## 2017-11-16 NOTE — ED Provider Notes (Addendum)
[facility] Emergency Department Provider Note  ____________________________________________   First MD Initiated Contact with Patient 11/16/17 0501     (approximate)  I have reviewed the triage vital signs and the nursing notes.   HISTORY  Chief Complaint Chest Pain and Shortness of Breath  Level 5 caveat:  history/ROS limited by acute/critical illness  HPI Cathy Guzman is a 72 y.o. female severe chronic illness as described below who was recently discharged from the hospital after an acute on chronic respiratory failure episode secondary to COPD and CHF and who reportedly has palliative care orders who presents for evaluation of worsening shortness of breath and hypoxia on her regular 2 L of oxygen.  The history was limited but apparently she has been gradually worsening at least over the last word.  She was given DuoNeb's and in spite of treatment her dose of breath worsened and she became increasingly tachycardic with an increased work of breathing.  He is also complaining of chest pain and was given 3 nitros prior to EMS being called.  She says that she is feeling better and her chest pain is now a 7 out of 10 instead of a 10.  Her shortness of breath has improved after EMS put her on BiPAP as well.  She initially was 84% on 2 L when EMS arrived.  She denies any recent fever but history is limited due to her respiratory distress.  She reports that her left arm hurts and it is notable for what appears to be dependent edema but I do not believe is acutely infected.   Past Medical History:  Diagnosis Date  . Acute myocardial infarction, subendocardial infarction, initial episode of care (HCC)   . Anginal syndrome (HCC)   . Atopic rhinitis   . Avitaminosis D   . Bacteremia   . Better eye: severe vision impairment; lesser eye: blind   . Bipolar disorder, unspecified (HCC)   . Cannot walk   . CHF (congestive heart failure) (HCC)   . CN (constipation)   .  Coronary atherosclerosis of native coronary artery   . Encounter for vocational therapy   . Esophageal reflux   . Essential hypertension, malignant   . Gout, unspecified   . Heart failure, diastolic (HCC)   . Hyperlipemia   . Hypomagnesemia   . Hypopotassemia   . Iron deficiency anemia, unspecified   . Muscle weakness (generalized)   . Obstructive chronic bronchitis without exacerbation (HCC)   . Oropharyngeal dysphagia   . Persistent disorder of initiating or maintaining sleep   . Rash and other nonspecific skin eruption   . Scar painful   . Scissor gait   . Shortness of breath   . Spasm of muscle   . Type II diabetes mellitus with renal manifestations (HCC)   . Urinary tract infection, site not specified     Patient Active Problem List   Diagnosis Date Noted  . DNR (do not resuscitate)   . Palliative care by specialist   . Stage 4 chronic kidney disease (HCC)   . Acute congestive heart failure (HCC)   . Acute on chronic respiratory failure with hypoxia (HCC) 11/10/2017  . COPD with acute exacerbation (HCC) 11/10/2017  . Acute on chronic respiratory failure (HCC) 11/10/2017  . Hyponatremia 11/03/2017  . Diabetes (HCC) 11/03/2017  . COPD (chronic obstructive pulmonary disease) (HCC) 11/03/2017  . CAD (coronary artery disease) 11/03/2017  . HLD (hyperlipidemia) 11/03/2017  . Chronic diastolic CHF (congestive heart failure) (HCC)  11/03/2017  . GERD (gastroesophageal reflux disease) 11/03/2017  . HTN (hypertension) 11/03/2017    Past Surgical History:  Procedure Laterality Date  . ABDOMINAL HYSTERECTOMY    . ABDOMINAL SURGERY    . CHOLECYSTECTOMY    . GASTRIC BYPASS      Prior to Admission medications   Medication Sig Start Date End Date Taking? Authorizing Provider  albuterol (PROVENTIL HFA;VENTOLIN HFA) 108 (90 Base) MCG/ACT inhaler Inhale 2 puffs into the lungs every 3 (three) hours as needed for wheezing or shortness of breath.    [provider]    allopurinol (ZYLOPRIM) 100 MG tablet Take 100 mg by mouth daily.    [provider]  alum & mag hydroxide-simeth (MAALOX/MYLANTA) 200-200-20 MG/5ML suspension Take by mouth every 4 (four) hours as needed for indigestion or heartburn.    [provider]  ARIPiprazole (ABILIFY) 20 MG tablet Take 20 mg by mouth daily.    [provider]  aspirin EC 81 MG tablet Take 81 mg by mouth daily.    [provider]  atorvastatin (LIPITOR) 80 MG tablet Take 80 mg by mouth daily.    [provider]  budesonide (PULMICORT) 0.25 MG/2ML nebulizer solution Take 2 mLs (0.25 mg total) by nebulization 2 (two) times daily. 11/14/17   Altamese DillingVachhani, Vaibhavkumar, MD  clopidogrel (PLAVIX) 75 MG tablet Take 75 mg by mouth daily.    [provider]  diclofenac sodium (VOLTAREN) 1 % GEL Apply 2 g topically 4 (four) times daily.    [provider]  dorzolamide (TRUSOPT) 2 % ophthalmic solution Place 1 drop into the right eye 2 (two) times daily.    [provider]  ferrous sulfate 325 (65 FE) MG EC tablet Take 325 mg by mouth daily with breakfast.    [provider]  fluticasone (FLONASE) 50 MCG/ACT nasal spray Place 1 spray into both nostrils daily.    [provider]  Fluticasone-Salmeterol (ADVAIR) 500-50 MCG/DOSE AEPB Inhale 1 puff into the lungs 2 (two) times daily.    [provider]  furosemide (LASIX) 40 MG tablet Take 1 tablet (40 mg total) by mouth daily. 11/15/17   Altamese DillingVachhani, Vaibhavkumar, MD  insulin glargine (LANTUS) 100 UNIT/ML injection Inject 0.1 mLs (10 Units total) into the skin at bedtime. 11/08/17   Enedina FinnerPatel, Sona, MD  ipratropium-albuterol (DUONEB) 0.5-2.5 (3) MG/3ML SOLN Take 3 mLs by nebulization every 6 (six) hours. 11/14/17   Altamese DillingVachhani, Vaibhavkumar, MD  isosorbide mononitrate (IMDUR) 60 MG 24 hr tablet Take 1 tablet (60 mg total) by mouth daily. 11/15/17   Altamese DillingVachhani, Vaibhavkumar, MD  lamoTRIgine (LAMICTAL) 100 MG tablet  Take 150 mg by mouth daily.    [provider]  Melatonin 10 MG TABS Take 10 mg by mouth at bedtime as needed (for sleeping).    [provider]  metoprolol tartrate (LOPRESSOR) 25 MG tablet Take 0.5 tablets (12.5 mg total) by mouth 2 (two) times daily. 11/14/17   Altamese DillingVachhani, Vaibhavkumar, MD  nitroGLYCERIN (NITROSTAT) 0.4 MG SL tablet Place 1 tablet (0.4 mg total) under the tongue every 5 (five) minutes as needed for chest pain. 11/14/17   Altamese DillingVachhani, Vaibhavkumar, MD  Omega 3 1000 MG CAPS Take 1,000 mg by mouth every evening.    [provider]  omega-3 acid ethyl esters (LOVAZA) 1 g capsule Take 1 g by mouth daily.    [provider]  omeprazole (PRILOSEC) 20 MG capsule Take 40 mg by mouth 2 (two) times daily before a meal.  [provider]  polyethylene glycol (MIRALAX / GLYCOLAX) packet Take 17 g by mouth daily.    [provider]  QUEtiapine (SEROQUEL) 25 MG tablet Take 25 mg by mouth 2 (two) times daily.    [provider]  QUEtiapine (SEROQUEL) 50 MG tablet Take 50 mg by mouth at bedtime.    [provider]  senna (SENOKOT) 8.6 MG TABS tablet Take 2 tablets by mouth at bedtime as needed for mild constipation.    [provider]  sertraline (ZOLOFT) 100 MG tablet Take 200 mg by mouth at bedtime.    [provider]  sucralfate (CARAFATE) 1 g tablet Take 1 g by mouth 4 (four) times daily.    [provider]  tiotropium (SPIRIVA) 18 MCG inhalation capsule Place 18 mcg into inhaler and inhale daily.    [provider]    Allergies Ace inhibitors; Bactrim [sulfamethoxazole-trimethoprim]; Doxycycline hyclate; Hydrocodone; Oxycodone; and Sulfanilamide  Family History  Problem Relation Age of Onset  . CAD Mother   . Glaucoma Mother   . COPD Father   . COPD Brother   . Parkinsonism Brother     Social History Social History   Tobacco Use  . Smoking status: Never Smoker  . Smokeless  tobacco: Never Used  Substance Use Topics  . Alcohol use: No    Frequency: Never  . Drug use: No    Review of Systems Level 5 caveat:  history/ROS limited by acute/critical illness  ____________________________________________   PHYSICAL EXAM:  VITAL SIGNS: ED Triage Vitals  Enc Vitals Group     BP 11/16/17 0457 115/78     Pulse Rate 11/16/17 0457 (!) 115     Resp 11/16/17 0457 19     Temp 11/16/17 0457 98.2 F (36.8 C)     Temp Source 11/16/17 0457 Axillary     SpO2 11/16/17 0457 99 %     Weight 11/16/17 0504 117.5 kg (259 lb)     Height 11/16/17 0504 1.727 m (5\' 8" )     Head Circumference --      [facility] Flow --      Pain Score 11/16/17 0503 7     Pain Loc --      Pain Edu? --      Excl. in GC? --     Constitutional: Alert and answering questions in short phrases.  Moderate respiratory distress on BiPAP.  Appears both acutely and chronically ill. Eyes: Conjunctivae are normal.  Head: Atraumatic. Nose: No congestion/rhinnorhea. Neck: No stridor.  No meningeal signs.   Cardiovascular: Tachycardia, regular rhythm. Good peripheral circulation. Grossly normal heart sounds. Respiratory: Creased respiratory effort with intercostal muscle retractions and coarse breath sounds throughout.  No wheezing. Gastrointestinal: Morbid obesity.  Soft and nontender. No distention.  Musculoskeletal: Patient has numerous areas of ecchymosis and bruising and her left arm appears chronically bruised with dependent edema all throughout the extremity. Neurologic:  Normal speech and language. No gross focal neurologic deficits are appreciated.  Skin:  Skin is warm, dry and intact.  Extensive ecchymoses throughout exposed skin surfaces with dependent edema and discoloration of the left upper extremity ____________________________________________   LABS (all labs ordered are listed, but only abnormal results are displayed)  Labs Reviewed  BLOOD GAS, ARTERIAL - Abnormal; Notable for the  following components:      Result Value   pCO2 arterial 53 (*)    pO2, Arterial 68 (*)    Bicarbonate 35.2 (*)    Acid-Base Excess  9.7 (*)    All other components within normal limits  COMPREHENSIVE METABOLIC PANEL - Abnormal; Notable for the following components:   Chloride 91 (*)    Glucose, Bld 228 (*)    BUN 53 (*)    Creatinine, Ser 2.47 (*)    Calcium 10.4 (*)    Albumin 3.3 (*)    Total Bilirubin 1.3 (*)    GFR calc non Af Amer 18 (*)    GFR calc Af Amer 21 (*)    Anion gap 18 (*)    All other components within normal limits  BRAIN NATRIURETIC PEPTIDE - Abnormal; Notable for the following components:   B Natriuretic Peptide 995.0 (*)    All other components within normal limits  TROPONIN I - Abnormal; Notable for the following components:   Troponin I 0.61 (*)    All other components within normal limits  CBC WITH DIFFERENTIAL/PLATELET - Abnormal; Notable for the following components:   WBC 13.5 (*)    RBC 3.08 (*)    Hemoglobin 8.7 (*)    HCT 28.0 (*)    MCHC 31.3 (*)    RDW 15.8 (*)    Neutro Abs 12.3 (*)    Lymphs Abs 0.4 (*)    All other components within normal limits  URINE CULTURE  LIPASE, BLOOD  LACTIC ACID, PLASMA  URINALYSIS, COMPLETE (UACMP) WITH MICROSCOPIC  CK   ____________________________________________  EKG  ED ECG REPORT I, Loleta Rose, the attending physician, personally viewed and interpreted this ECG.  Date: 11/16/2017 EKG Time: 4:57 AM Rate: 120 Rhythm: Sinus tachycardia QRS Axis: normal Intervals: Right bundle branch block and left posterior fascicular block ST/T Wave abnormalities: ST depression most notable in the lateral leads Narrative Interpretation: Suggestive of acute ischemia but no significant change from prior EKG   ____________________________________________  RADIOLOGY I, Loleta Rose, personally viewed and evaluated these images (plain radiographs) as part of my medical decision making, as well as reviewing the  written report by the radiologist.  ED MD interpretation: Diffuse pulmonary edema  Official radiology report(s): Dg Chest Portable 1 View  Result Date: 11/16/2017 CLINICAL DATA:  Initial evaluation for acute chest pain with shortness of breath. EXAM: PORTABLE CHEST 1 VIEW COMPARISON:  Prior radiograph from 11/11/2017. FINDINGS: Stable cardiomegaly.  Mediastinal silhouette within normal limits. Lungs hypoinflated. Diffuse vascular congestion with interstitial prominence, suggesting CHF. No obvious large pleural effusion. Superimposed infiltrates would be difficult to exclude, but are less favored. No pneumothorax. Osseous structures are unchanged. IMPRESSION: Cardiomegaly with moderate diffuse pulmonary edema, suggesting CHF. Electronically Signed   By: Rise Mu M.D.   On: 11/16/2017 05:45    ____________________________________________   PROCEDURES  Critical Care performed: Yes, see critical care procedure note(s)   Procedure(s) performed:   .Critical Care Performed by: Loleta Rose, MD Authorized by: Loleta Rose, MD   Critical care provider statement:    Critical care time (minutes):  45   Critical care time was exclusive of:  Separately billable procedures and treating other patients   Critical care was necessary to treat or prevent imminent or life-threatening deterioration of the following conditions:  Respiratory failure   Critical care was time spent personally by me on the following activities:  Development of treatment plan with patient or surrogate, discussions with consultants, evaluation of patient's response to treatment, examination of patient, obtaining history from patient or surrogate, ordering and performing treatments and interventions, ordering and review of laboratory studies, ordering and review of radiographic studies, pulse oximetry,  re-evaluation of patient's condition and review of old  charts     ____________________________________________   INITIAL IMPRESSION / ASSESSMENT AND PLAN / ED COURSE  As part of my medical decision making, I reviewed the following data within the electronic MEDICAL RECORD NUMBER Nursing notes reviewed and incorporated, Labs reviewed , EKG interpreted , Old EKG reviewed, Radiograph reviewed , Discussed with admitting physician  and Notes from prior ED visits    Differential diagnosis includes, but is not limited to, CHF exacerbation, COPD exacerbation, healthcare associated pneumonia, pulmonary embolism, ACS, etc.  Evaluation was generally stable from prior although ABG does show hypoxia and hypercapnia but not at critical levels and the patient is tolerating BiPAP well.  Radiograph is indicative of pulmonary edema and I am ordering Lasix 40 mg IV.  Patient is afebrile with no leukocytosis and a normal lactic acid and I do not believe this represents pneumonia or other acute infection.  Her lungs are coarse but she has no wheezing and she is moving good air.  Kidney function is roughly at baseline.  Troponin is elevated at about 0.6 but that is down from a peak troponin of around 3 during the prior admission.  No indication for heparin drip at this time.  Discussed the case with Dr. Sheryle Hail with the hospitalist service who will admit   Clinical Course as of Nov 16 605  Thu Nov 16, 2017  0602 Staff at [employer] (and paperwork) confirms DNR/DNI, but the [employer]rod form cannot be located at [facility] at this time.  Will continue as per patient/family wishes.  [CF]  0603 Daughter confirmed by phone the desire for Bipap/CPAP, but not intubation  [CF]    Clinical Course User Index [CF] Loleta Rose, MD    ____________________________________________  FINAL CLINICAL IMPRESSION(S) / ED DIAGNOSES  Final diagnoses:  Acute on chronic respiratory failure with hypoxia and hypercapnia (HCC)  Acute on chronic congestive heart failure, unspecified heart  failure type (HCC)  Elevated troponin I level  Chronic kidney disease, unspecified CKD stage  Dependent edema     MEDICATIONS GIVEN DURING THIS VISIT:  Medications  furosemide (LASIX) injection 40 mg (not administered)     ED Discharge Orders    None       Note:  This document was prepared using Dragon voice recognition software and may include unintentional dictation errors.    Loleta Rose, MD 11/16/17 1610    Loleta Rose, MD 11/16/17 [phone]

## 2017-11-16 NOTE — Care Management (Addendum)
Cm informed that family would like to pursue a discharge home [facility] with hospice services in the home.  CM will  initiate this plan 2.8

## 2017-11-16 NOTE — Consult Note (Signed)
Reason for Consult: Respiratory failure requiring BiPAP   referring Physician: Hospitalist service  Cathy Guzman is an 72 y.o. female.  HPI: With multiple medical problems to include hypertension, hyperlipidemia, Mrs. Cathy Guzman is a 72 year old female chronic obstructive pulmonary disease, chronic hypercapnic respiratory failure, coronary artery disease, gastroesophageal reflux disease, diastolic heart failure, iron deficiency anemia who presented to the emergency department after recently being discharged from rehabilitation.  She became short of breath with some associated chest discomfort.  Chest x-ray showed pulmonary edema pattern, her respiratory status worsened requiring noninvasive ventilation.  Pertinent labs in the emergency department revealed a troponin of 0.61, BUN is 53 with a creatinine of 2.47, chloride is 91, Anion gap is 18, leukocytosis of 13.5, hemoglobin of 8.7, arterial blood gas on 16/6 was 7.43/50 3/68.  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  Presently the patient is awake and communicating states that her breathing is somewhat improved.  Past Medical History:  Diagnosis Date  . Acute myocardial infarction, subendocardial infarction, initial episode of care ([place])   . 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 Atopic rhinitis   . Avitaminosis D   . Bacteremia   . Better eye: severe vision impairment; lesser eye: blind   . Bipolar disorder, unspecified ([place])   . Cannot walk   . CHF (congestive heart failure) ([place])   . CN (constipation)   . Coronary atherosclerosis of native coronary artery   . Encounter for vocational therapy   . Esophageal reflux   . Essential hypertension, malignant   . Gout, unspecified   . Heart failure, diastolic ([place])   . Hyperlipemia   . Hypomagnesemia   . Hypopotassemia   . Iron deficiency anemia, unspecified   . Muscle weakness (generalized)   .  Obstructive chronic bronchitis without exacerbation ([place])   . Oropharyngeal dysphagia   . Persistent disorder of initiating or maintaining sleep   . Rash and other nonspecific skin eruption   . Scar painful   . Scissor gait   . Shortness of breath   . Spasm of muscle   . Type II diabetes mellitus with renal manifestations ([place])   . Urinary tract infection, site not specified     Past Surgical History:  Procedure Laterality Date  . ABDOMINAL HYSTERECTOMY    . ABDOMINAL SURGERY    . CHOLECYSTECTOMY    . GASTRIC BYPASS      Family History  Problem Relation Age of Onset  . CAD Mother   . Glaucoma Mother   . COPD Father   . COPD Brother   . Parkinsonism Brother     Social History:  reports that  has never smoked. she has never used smokeless tobacco. She reports that she does not drink alcohol or use drugs.  Allergies:  Allergies  Allergen Reactions  . Ace Inhibitors     unknown  . Bactrim [Sulfamethoxazole-Trimethoprim] Itching  . Doxycycline Hyclate     unknown  . Hydrocodone     unknown  . Oxycodone     unknown  . Sulfanilamide     unknown    Medications: I have reviewed the patient's current medications.  Results for orders placed or performed during the hospital encounter of 11/16/17 (from the past 48 hour(s))  Blood gas, arterial     Status: Abnormal   Collection Time: 11/16/17  5:01 AM  Result Value Ref Range   FIO2 0.40    Delivery systems BILEVEL  POSITIVE AIRWAY PRESSURE    Inspiratory PAP 16    Expiratory PAP 6    pH, Arterial 7.43 7.350 - 7.450   pCO2 arterial 53 (H) 32.0 - 48.0 mmHg   pO2, Arterial 68 (L) 83.0 - 108.0 mmHg   Bicarbonate 35.2 (H) 20.0 - 28.0 mmol/L   Acid-Base Excess 9.7 (H) 0.0 - 2.0 mmol/L   O2 Saturation 93.8 %   Patient temperature 37.0    Collection site RIGHT RADIAL    Sample type ARTERIAL DRAW    Allens test (pass/fail) PASS PASS    Comment: Performed at [facility], [address] [place], [place] [address]   Comprehensive metabolic panel     Status: Abnormal   Collection Time: 11/16/17  5:02 AM  Result Value Ref Range   Sodium 140 135 - 145 mmol/L   Potassium 4.1 3.5 - 5.1 mmol/L   Chloride 91 (L) 101 - 111 mmol/L   CO2 31 22 - 32 mmol/L   Glucose, Bld 228 (H) 65 - 99 mg/dL   BUN 53 (H) 6 - 20 mg/dL   Creatinine, Ser 2.47 (H) 0.44 - 1.00 mg/dL   Calcium 10.4 (H) 8.9 - 10.3 mg/dL   Total Protein 6.6 6.5 - 8.1 g/dL   Albumin 3.3 (L) 3.5 - 5.0 g/dL   AST 23 15 - 41 U/L   ALT 16 14 - 54 U/L   Alkaline Phosphatase 87 38 - 126 U/L   Total Bilirubin 1.3 (H) 0.3 - 1.2 mg/dL   GFR calc non Af Amer 18 (L) >60 mL/min   GFR calc Af Amer 21 (L) >60 mL/min    Comment: (NOTE) The eGFR has been calculated using the CKD EPI equation. This calculation has not been validated in all clinical situations. eGFR's persistently <60 mL/min signify possible Chronic Kidney Disease.    Anion gap 18 (H) 5 - 15    Comment: Performed at [facility], [address] [place], [place] [address]  Lipase, blood     Status: None   Collection Time: 11/16/17  5:02 AM  Result Value Ref Range   Lipase 23 11 - 51 U/L    Comment: Performed at [facility], [address] [place], [place] [address]  Brain natriuretic peptide     Status: Abnormal   Collection Time: 11/16/17  5:02 AM  Result Value Ref Range   B Natriuretic Peptide 995.0 (H) 0.0 - 100.0 pg/mL    Comment: Performed at [facility], [address] [place], [place] [address]  Troponin I     Status: Abnormal   Collection Time: 11/16/17  5:02 AM  Result Value Ref Range   Troponin I 0.61 (HH) <0.03 ng/mL    Comment: CRITICAL RESULT CALLED TO, READ BACK BY AND VERIFIED WITH  KASEY ROBERTS AT 6812 11/16/17 SDR Performed at [facility], [address] [place], [place] [address]   CBC with Differential     Status: Abnormal   Collection Time: 11/16/17  5:02 AM  Result Value Ref Range   WBC 13.5 (H) 3.6 - 11.0 K/uL    RBC 3.08 (L) 3.80 - 5.20 MIL/uL   Hemoglobin 8.7 (L) 12.0 - 16.0 g/dL   HCT 28.0 (L) 35.0 - 47.0 %   MCV 90.7 80.0 - 100.0 fL   MCH 28.4 26.0 - 34.0 pg   MCHC 31.3 (L) 32.0 - 36.0 g/dL   RDW 15.8 (H) 11.5 - 14.5 %   Platelets 227 150 -  440 K/uL   Neutrophils Relative % 91 %   Neutro Abs 12.3 (H) 1.4 - 6.5 K/uL   Lymphocytes Relative 3 %   Lymphs Abs 0.4 (L) 1.0 - 3.6 K/uL   Monocytes Relative 5 %   Monocytes Absolute 0.6 0.2 - 0.9 K/uL   Eosinophils Relative 1 %   Eosinophils Absolute 0.1 0 - 0.7 K/uL   Basophils Relative 0 %   Basophils Absolute 0.1 0 - 0.1 K/uL    Comment: Performed at [facility], [address] [place], [place] [address]  CK     Status: Abnormal   Collection Time: 11/16/17  5:02 AM  Result Value Ref Range   Total CK 20 (L) 38 - 234 U/L    Comment: Performed at [facility], [address] [place], [place] [address]  Lactic acid, plasma     Status: None   Collection Time: 11/16/17  5:03 AM  Result Value Ref Range   Lactic Acid, Venous 1.4 0.5 - 1.9 mmol/L    Comment: Performed at [facility], [address] [place], [place] [address]  Urinalysis, Complete w Microscopic     Status: Abnormal   Collection Time: 11/16/17  5:03 AM  Result Value Ref Range   Color, Urine YELLOW (A) YELLOW   APPearance CLOUDY (A) CLEAR   Specific Gravity, Urine 1.005 1.005 - 1.030   pH 7.0 5.0 - 8.0   Glucose, UA NEGATIVE NEGATIVE mg/dL   Hgb urine dipstick SMALL (A) NEGATIVE   Bilirubin Urine NEGATIVE NEGATIVE   Ketones, ur NEGATIVE NEGATIVE mg/dL   Protein, ur NEGATIVE NEGATIVE mg/dL   Nitrite NEGATIVE NEGATIVE   Leukocytes, UA LARGE (A) NEGATIVE   RBC / HPF 0-5 0 - 5 RBC/hpf   WBC, UA TOO NUMEROUS TO COUNT 0 - 5 WBC/hpf   Bacteria, UA MANY (A) NONE SEEN   Squamous Epithelial / LPF 0-5 (A) NONE SEEN   WBC Clumps PRESENT     Comment: Performed at [facility], [address] [place], [place] [address]    Dg Chest Portable 1  View  Result Date: 11/16/2017 CLINICAL DATA:  Initial evaluation for acute chest pain with shortness of breath. EXAM: PORTABLE CHEST 1 VIEW COMPARISON:  Prior radiograph from 11/11/2017. FINDINGS: Stable cardiomegaly.  Mediastinal silhouette within normal limits. Lungs hypoinflated. Diffuse vascular congestion with interstitial prominence, suggesting CHF. No obvious large pleural effusion. Superimposed infiltrates would be difficult to exclude, but are less favored. No pneumothorax. Osseous structures are unchanged. IMPRESSION: Cardiomegaly with moderate diffuse pulmonary edema, suggesting CHF. Electronically Signed   By: Jeannine Boga M.D.   On: 11/16/2017 05:45    ROS Limited review of systems secondary to patient being on BiPAP and in respiratory distress Blood pressure 140/88, pulse (!) 111, temperature 99.1 F (37.3 C), resp. rate (!) 24, height 5' 8"  (1.727 m), weight 259 lb (117.5 kg), SpO2 92 %. Physical Exam  Patient is awake, somnolent but easily arousable and communicates.  Presently on noninvasive ventilation 16/6 HEENT: Oral exam is limited, trachea is midline, no thyromegaly appreciated, difficult to assess jugular venous distention Cardiovascular: Regular rhythm tachycardia, sinus mechanism noted on monitor Pulmonary: Crackles appreciated in both lung fields bibasilar Abdominal: Massive obesity, generally soft exam hypoactive bowel sounds Extremities: Chronic venous stasis changes noted with edema present Neurologic: Patient is responsive, moves all extremities no clear gross deficits noted  Assessment/Plan: Patient with respiratory failure requiring noninvasive ventilation.  Chest x-ray is consistent with congestive heart  failure.  Bronchodilator therapy with albuterol and Atrovent as needed.  Would also do flu swab since patient has a sick contact.  Diuresis as able, no clinical evidence of infection at this time, would obtain serial EKG and cardiac enzymes with patient's  prior cardiac history.  Leukocytosis of 13.5.  Will follow  Anemia normocytic normochromic without any evidence of active bleeding  Anion gap metabolic acidosis.  Anion gap of 18, delta gap of 6, calculated starting bicarb of 37 consistent with an initial metabolic alkalosis and subsequent anion gap metabolic acidosis. Will follow   Heavin Sebree 11/16/2017, 11:49 AM

## 2017-11-16 NOTE — H&P (Addendum)
Cathy Guzman is an 72 y.o. female.   Chief Complaint: Shortness of breath HPI: The patient with past medical history of CAD, COPD, CHF and diabetes presents the emergency department via EMS due to shortness of breath.  The patient states that she rapidly became short of breath with associated chest pain prior to seek evaluation in the emergency department.  Chest x-ray showed pulmonary edema. She was placed on BiPAP due to her increased work of breathing and received Lasix.   Improved her chest pain to some degree but the patient continued to require respiratory support which prompted the emergency department staff to call the hospitalist service for admission.  Past Medical History:  Diagnosis Date  . Acute myocardial infarction, subendocardial infarction, initial episode of care ([place])   . 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 Atopic rhinitis   . Avitaminosis D   . Bacteremia   . Better eye: severe vision impairment; lesser eye: blind   . Bipolar disorder, unspecified ([place])   . Cannot walk   . CHF (congestive heart failure) ([place])   . CN (constipation)   . Coronary atherosclerosis of native coronary artery   . Encounter for vocational therapy   . Esophageal reflux   . Essential hypertension, malignant   . Gout, unspecified   . Heart failure, diastolic ([place])   . Hyperlipemia   . Hypomagnesemia   . Hypopotassemia   . Iron deficiency anemia, unspecified   . Muscle weakness (generalized)   . Obstructive chronic bronchitis without exacerbation ([place])   . Oropharyngeal dysphagia   . Persistent disorder of initiating or maintaining sleep   . Rash and other nonspecific skin eruption   . Scar painful   . Scissor gait   . Shortness of breath   . Spasm of muscle   . Type II diabetes mellitus with renal manifestations ([place])   . Urinary tract infection, site not specified     Past Surgical History:  Procedure Laterality Date  . ABDOMINAL HYSTERECTOMY    . ABDOMINAL SURGERY    . CHOLECYSTECTOMY     . GASTRIC BYPASS      Family History  Problem Relation Age of Onset  . CAD Mother   . Glaucoma Mother   . COPD Father   . COPD Brother   . Parkinsonism Brother    Social History:  reports that  has never smoked. she has never used smokeless tobacco. She reports that she does not drink alcohol or use drugs.  Allergies:  Allergies  Allergen Reactions  . Ace Inhibitors     unknown  . Bactrim [Sulfamethoxazole-Trimethoprim] Itching  . Doxycycline Hyclate     unknown  . Hydrocodone     unknown  . Oxycodone     unknown  . Sulfanilamide     unknown    Prior to Admission medications   Medication Sig Start Date End Date Taking? Authorizing Provider  albuterol (PROVENTIL HFA;VENTOLIN HFA) 108 (90 Base) MCG/ACT inhaler Inhale 2 puffs into the lungs every 3 (three) hours as needed for wheezing or shortness of breath.   Yes [provider]  allopurinol (ZYLOPRIM) 100 MG tablet Take 100 mg by mouth daily.   Yes [provider]  alum & mag hydroxide-simeth (MAALOX/MYLANTA) 200-200-20 MG/5ML suspension Take 30 mLs by mouth every 4 (four) hours as needed for indigestion or heartburn.    Yes [provider]  ARIPiprazole (ABILIFY) 20 MG tablet Take 20 mg by mouth daily.   Yes [provider]  aspirin  EC 81 MG tablet Take 81 mg by mouth daily.   Yes [provider]  atorvastatin (LIPITOR) 80 MG tablet Take 80 mg by mouth daily.   Yes [provider]  budesonide (PULMICORT) 0.25 MG/2ML nebulizer solution Take 2 mLs (0.25 mg total) by nebulization 2 (two) times daily. 11/14/17  Yes Vaughan Basta, MD  clopidogrel (PLAVIX) 75 MG tablet Take 75 mg by mouth daily.   Yes [provider]  diclofenac sodium (VOLTAREN) 1 % GEL Apply 2 g topically 4 (four) times daily.   Yes [provider]  dorzolamide (TRUSOPT) 2 % ophthalmic solution Place 1 drop into the right eye 2 (two) times daily.   Yes [provider]   ferrous sulfate 325 (65 FE) MG EC tablet Take 325 mg by mouth daily with breakfast.   Yes [provider]  fluticasone (FLONASE) 50 MCG/ACT nasal spray Place 1 spray into both nostrils daily.   Yes [provider]  Fluticasone-Salmeterol (ADVAIR) 500-50 MCG/DOSE AEPB Inhale 1 puff into the lungs 2 (two) times daily.   Yes [provider]  furosemide (LASIX) 40 MG tablet Take 1 tablet (40 mg total) by mouth daily. 11/15/17  Yes Vaughan Basta, MD  insulin glargine (LANTUS) 100 UNIT/ML injection Inject 0.1 mLs (10 Units total) into the skin at bedtime. 11/08/17  Yes Fritzi Mandes, MD  ipratropium-albuterol (DUONEB) 0.5-2.5 (3) MG/3ML SOLN Take 3 mLs by nebulization every 6 (six) hours. 11/14/17  Yes Vaughan Basta, MD  isosorbide mononitrate (IMDUR) 60 MG 24 hr tablet Take 1 tablet (60 mg total) by mouth daily. 11/15/17  Yes Vaughan Basta, MD  lamoTRIgine (LAMICTAL) 100 MG tablet Take 150 mg by mouth daily.   Yes [provider]  Melatonin 10 MG TABS Take 10 mg by mouth at bedtime as needed (for sleeping).   Yes [provider]  metoprolol tartrate (LOPRESSOR) 25 MG tablet Take 0.5 tablets (12.5 mg total) by mouth 2 (two) times daily. 11/14/17  Yes Vaughan Basta, MD  nitroGLYCERIN (NITROSTAT) 0.4 MG SL tablet Place 1 tablet (0.4 mg total) under the tongue every 5 (five) minutes as needed for chest pain. 11/14/17  Yes Vaughan Basta, MD  Omega 3 1000 MG CAPS Take 1,000 mg by mouth every evening.   Yes [provider]  omega-3 acid ethyl esters (LOVAZA) 1 g capsule Take 1 g by mouth daily.   Yes [provider]  omeprazole (PRILOSEC) 20 MG capsule Take 40 mg by mouth 2 (two) times daily before a meal.   Yes [provider]  polyethylene glycol (MIRALAX / GLYCOLAX) packet Take 17 g by mouth daily.   Yes [provider]  QUEtiapine (SEROQUEL) 25 MG tablet Take 25 mg by mouth 2 (two) times daily.    Yes [provider]  QUEtiapine (SEROQUEL) 50 MG tablet Take 50 mg by mouth at bedtime.   Yes [provider]  senna (SENOKOT) 8.6 MG TABS tablet Take 2 tablets by mouth at bedtime as needed for mild constipation.   Yes [provider]  sertraline (ZOLOFT) 100 MG tablet Take 200 mg by mouth at bedtime.   Yes [provider]  sucralfate (CARAFATE) 1 g tablet Take 1 g by mouth 4 (four) times daily.   Yes [provider]  tiotropium (SPIRIVA) 18 MCG inhalation capsule Place 18 mcg into inhaler and inhale daily.   Yes [provider]     Results for orders placed or performed during the hospital encounter of  11/16/17 (from the past 48 hour(s))  Blood gas, arterial     Status: Abnormal   Collection Time: 11/16/17  5:01 AM  Result Value Ref Range   FIO2 0.40    Delivery systems BILEVEL POSITIVE AIRWAY PRESSURE    Inspiratory PAP 16    Expiratory PAP 6    pH, Arterial 7.43 7.350 - 7.450   pCO2 arterial 53 (H) 32.0 - 48.0 mmHg   pO2, Arterial 68 (L) 83.0 - 108.0 mmHg   Bicarbonate 35.2 (H) 20.0 - 28.0 mmol/L   Acid-Base Excess 9.7 (H) 0.0 - 2.0 mmol/L   O2 Saturation 93.8 %   Patient temperature 37.0    Collection site RIGHT RADIAL    Sample type ARTERIAL DRAW    Allens test (pass/fail) PASS PASS    Comment: Performed at [facility], [address] [place], [place] [address]  Comprehensive metabolic panel     Status: Abnormal   Collection Time: 11/16/17  5:02 AM  Result Value Ref Range   Sodium 140 135 - 145 mmol/L   Potassium 4.1 3.5 - 5.1 mmol/L   Chloride 91 (L) 101 - 111 mmol/L   CO2 31 22 - 32 mmol/L   Glucose, Bld 228 (H) 65 - 99 mg/dL   BUN 53 (H) 6 - 20 mg/dL   Creatinine, Ser 2.47 (H) 0.44 - 1.00 mg/dL   Calcium 10.4 (H) 8.9 - 10.3 mg/dL   Total Protein 6.6 6.5 - 8.1 g/dL   Albumin 3.3 (L) 3.5 - 5.0 g/dL   AST 23 15 - 41 U/L   ALT 16 14 - 54 U/L   Alkaline Phosphatase 87 38 - 126 U/L   Total Bilirubin 1.3  (H) 0.3 - 1.2 mg/dL   GFR calc non Af Amer 18 (L) >60 mL/min   GFR calc Af Amer 21 (L) >60 mL/min    Comment: (NOTE) The eGFR has been calculated using the CKD EPI equation. This calculation has not been validated in all clinical situations. eGFR's persistently <60 mL/min signify possible Chronic Kidney Disease.    Anion gap 18 (H) 5 - 15    Comment: Performed at [facility], [address] [place], [place] [address]  Lipase, blood     Status: None   Collection Time: 11/16/17  5:02 AM  Result Value Ref Range   Lipase 23 11 - 51 U/L    Comment: Performed at [facility], [address] [place], [place] [address]  Brain natriuretic peptide     Status: Abnormal   Collection Time: 11/16/17  5:02 AM  Result Value Ref Range   B Natriuretic Peptide 995.0 (H) 0.0 - 100.0 pg/mL    Comment: Performed at [facility], [address] [place], [place] [address]  Troponin I     Status: Abnormal   Collection Time: 11/16/17  5:02 AM  Result Value Ref Range   Troponin I 0.61 (HH) <0.03 ng/mL    Comment: CRITICAL RESULT CALLED TO, READ BACK BY AND VERIFIED WITH  KASEY ROBERTS AT 1287 11/16/17 SDR Performed at [facility], [address] [place], [place] [address]   CBC with Differential     Status: Abnormal   Collection Time: 11/16/17  5:02 AM  Result Value Ref Range   WBC 13.5 (H) 3.6 - 11.0 K/uL   RBC 3.08 (L) 3.80 - 5.20 MIL/uL   Hemoglobin 8.7 (L) 12.0 - 16.0 g/dL   HCT 28.0 (L) 35.0 - 47.0 %  MCV 90.7 80.0 - 100.0 fL   MCH 28.4 26.0 - 34.0 pg   MCHC 31.3 (L) 32.0 - 36.0 g/dL   RDW 15.8 (H) 11.5 - 14.5 %   Platelets 227 150 - 440 K/uL   Neutrophils Relative % 91 %   Neutro Abs 12.3 (H) 1.4 - 6.5 K/uL   Lymphocytes Relative 3 %   Lymphs Abs 0.4 (L) 1.0 - 3.6 K/uL   Monocytes Relative 5 %   Monocytes Absolute 0.6 0.2 - 0.9 K/uL   Eosinophils Relative 1 %   Eosinophils Absolute 0.1 0 - 0.7 K/uL   Basophils Relative 0 %   Basophils Absolute  0.1 0 - 0.1 K/uL    Comment: Performed at [facility], [address] [place], [place] [address]  CK     Status: Abnormal   Collection Time: 11/16/17  5:02 AM  Result Value Ref Range   Total CK 20 (L) 38 - 234 U/L    Comment: Performed at [facility], [address] [place], [place] [address]  Lactic acid, plasma     Status: None   Collection Time: 11/16/17  5:03 AM  Result Value Ref Range   Lactic Acid, Venous 1.4 0.5 - 1.9 mmol/L    Comment: Performed at [facility], [address] [place], [place] [address]  Urinalysis, Complete w Microscopic     Status: Abnormal   Collection Time: 11/16/17  5:03 AM  Result Value Ref Range   Color, Urine YELLOW (A) YELLOW   APPearance CLOUDY (A) CLEAR   Specific Gravity, Urine 1.005 1.005 - 1.030   pH 7.0 5.0 - 8.0   Glucose, UA NEGATIVE NEGATIVE mg/dL   Hgb urine dipstick SMALL (A) NEGATIVE   Bilirubin Urine NEGATIVE NEGATIVE   Ketones, ur NEGATIVE NEGATIVE mg/dL   Protein, ur NEGATIVE NEGATIVE mg/dL   Nitrite NEGATIVE NEGATIVE   Leukocytes, UA LARGE (A) NEGATIVE   RBC / HPF 0-5 0 - 5 RBC/hpf   WBC, UA TOO NUMEROUS TO COUNT 0 - 5 WBC/hpf   Bacteria, UA MANY (A) NONE SEEN   Squamous Epithelial / LPF 0-5 (A) NONE SEEN   WBC Clumps PRESENT     Comment: Performed at [facility], [address], [place], [place] [address]   Dg Chest Portable 1 View  Result Date: 11/16/2017 CLINICAL DATA:  Initial evaluation for acute chest pain with shortness of breath. EXAM: PORTABLE CHEST 1 VIEW COMPARISON:  Prior radiograph from 11/11/2017. FINDINGS: Stable cardiomegaly.  Mediastinal silhouette within normal limits. Lungs hypoinflated. Diffuse vascular congestion with interstitial prominence, suggesting CHF. No obvious large pleural effusion. Superimposed infiltrates would be difficult to exclude, but are less favored. No pneumothorax. Osseous structures are unchanged. IMPRESSION: Cardiomegaly with moderate diffuse  pulmonary edema, suggesting CHF. Electronically Signed   By: Jeannine Boga M.D.   On: 11/16/2017 05:45    Review of Systems  Constitutional: Negative for chills and fever.  HENT: Negative for sore throat and tinnitus.   Eyes: Negative for blurred vision and redness.  Respiratory: Positive for shortness of breath. Negative for cough.   Cardiovascular: Positive for chest pain. Negative for palpitations, orthopnea and PND.  Gastrointestinal: Negative for abdominal pain, diarrhea, nausea and vomiting.  Genitourinary: Negative for dysuria, frequency and urgency.  Musculoskeletal: Negative for joint pain and myalgias.  Skin: Negative for rash.       No lesions  Neurological: Negative for speech change, focal weakness and weakness.  Endo/Heme/Allergies: Does not bruise/bleed  easily.       No temperature intolerance  Psychiatric/Behavioral: Negative for depression and suicidal ideas.    Blood pressure 123/72, pulse (!) 106, temperature 99.1 F (37.3 C), resp. rate 18, height _0  (1.727 m), weight 117.5 kg (259 lb), SpO2 100 %. Physical Exam  Vitals reviewed. Constitutional: She is oriented to person, place, and time. She appears well-developed and well-nourished. She appears distressed.  HENT:  Head: Normocephalic and atraumatic.  Mouth/Throat: Oropharynx is clear and moist.  Left eye prosthetic  Eyes: Conjunctivae and EOM are normal. Pupils are equal, round, and reactive to light.  Neck: Normal range of motion. Neck supple. No JVD present. No tracheal deviation present. No thyromegaly present.  Cardiovascular: Normal rate, regular rhythm and normal heart sounds. Exam reveals no gallop and no friction rub.  No murmur heard. Respiratory: Effort normal and breath sounds normal.  GI: Soft. Bowel sounds are normal. She exhibits no distension. There is no tenderness.  Genitourinary:  Genitourinary Comments: Deferred  Musculoskeletal: Normal range of motion. She exhibits edema.   Lymphadenopathy:    She has no cervical adenopathy.  Neurological: She is alert and oriented to person, place, and time. No cranial nerve deficit. She exhibits normal muscle tone.  Skin: Skin is warm and dry. Bruising, ecchymosis and petechiae noted.  Psychiatric: She has a normal mood and affect. Her behavior is normal. Judgment and thought content normal.     Assessment/Plan This is a 72 year old female admitted for acute on chronic diastolic heart failure. 1.  CHF: Acute on chronic; diastolic.  Patient has pulmonary edema associated shortness of breath.  BiPAP is improved her symptoms.  Patient is received Lasix we will continue diuresis. 2.  Respiratory failure: Acute on chronic; with hypoxia.  I have tried the patient off of BiPAP but she required 5 L of oxygen via nasal cannula.  She will likely continue to need noninvasive positive pressure ventilation until fluid balance is restored. 3.  Chronic kidney disease: Stage IV; avoid nephrotoxic agents 4.  Chest pain: Stable; continue aspirin, Plavix and Imdur 5.  COPD: Continue inhaled corticosteroid.  Albuterol as needed. 6.  Diabetes mellitus type 2: Continue basal insulin adjusted for hospital diet.  Sliding to insulin while hospitalized as well 7.  Hyperlipidemia: Continue statin therapy 8.  Depression: Continue sertraline The patient is a DNR.  Time spent on admission orders and critical care approximately 45 minutes.  Discussed with ICU attending  Harrie Foreman, MD 11/16/2017, 7:51 AM

## 2017-11-16 NOTE — Consult Note (Addendum)
Consultation Note Date: 11/16/2017   Patient Name: Cathy Guzman  DOB: 1946/10/08  MRN: [id number]  Age / Sex: 72 y.o., female  PCP: Dorothey Baseman, MD Referring Physician: Altamese Dilling, *  Reason for Consultation: Establishing goals of care  HPI/Patient Profile: The patient with past medical history of CAD, COPD, CHF and diabetes presents the emergency department via EMS due to shortness of breath and chest pain.     Clinical Assessment and Goals of Care: [name] was recently discharged from this facility within the last 48 hours to [facility]. She was treated for an NSTEMI in December 2018, and was sent to [facility]. She was returned for other admissions.  She has returned for admission and is resting in bed with BIPAP in place. She is alert and oriented. Daughter at bedside.  Her daughter states they have decided that [name] would like to go home with hospice. [name] has cats she has not seen in months and would like to see them again, and die at home. Support offered for daughter, she states she lost her father several months ago.   CCM at bedside.The patient and daughter are aware she may not become stable enough to be discharged home, and her greatest goal is to remain comfortable whether that be here in the hospital for end of life care, or at home.   Daughter Arline Asp spoke with PCP Army Fossa via phone and requested I speak with her. Olegario Messier states [name] has spoken of being comfortable and hospice is in line with her wishes. She provided her phone number for any needs [phone].    CM updated.     SUMMARY OF RECOMMENDATIONS    Please attempt to optimize patient for discharge home to [facility] with hospice.   Code Status/Advance Care Planning:  DNR    Symptom Management:   Per primary team  Palliative Prophylaxis:   Oral Care  Prognosis:   < 2 weeks  Frequent hospitalizations, NSTEMI in December 2018, no cardiac cath due to comorbidities. COPD and CHF requiring BIPAP.   Discharge Planning: Home with Hospice      Primary Diagnoses: Present on Admission: . Acute on chronic respiratory failure with hypoxia (HCC)   I have reviewed the medical record, interviewed the patient and family, and examined the patient. The following aspects are pertinent.  Past Medical History:  Diagnosis Date  . Acute myocardial infarction, subendocardial infarction, initial episode of care (HCC)   . Anginal syndrome (HCC)   . Atopic rhinitis   . Avitaminosis D   . Bacteremia   . Better eye: severe vision impairment; lesser eye: blind   . Bipolar disorder, unspecified (HCC)   . Cannot walk   . CHF (congestive heart failure) (HCC)   . CN (constipation)   . Coronary atherosclerosis of native coronary artery   . Encounter for vocational therapy   . Esophageal reflux   . Essential hypertension, malignant   . Gout, unspecified   . Heart failure, diastolic (HCC)   .  Hyperlipemia   . Hypomagnesemia   . Hypopotassemia   . Iron deficiency anemia, unspecified   . Muscle weakness (generalized)   . Obstructive chronic bronchitis without exacerbation (HCC)   . Oropharyngeal dysphagia   . Persistent disorder of initiating or maintaining sleep   . Rash and other nonspecific skin eruption   . Scar painful   . Scissor gait   . Shortness of breath   . Spasm of muscle   . Type II diabetes mellitus with renal manifestations (HCC)   . Urinary tract infection, site not specified    Social History   Socioeconomic History  . Marital status: Divorced    Spouse name: None  . Number of children: None  . Years of education: None  . Highest education level: None  Social Needs  . Financial resource strain: None  . Food insecurity - worry: None  . Food insecurity - inability: None  . Transportation needs - medical: None  . Transportation needs - non-medical:  None  Occupational History  . None  Tobacco Use  . Smoking status: Never Smoker  . Smokeless tobacco: Never Used  Substance and Sexual Activity  . Alcohol use: No    Frequency: Never  . Drug use: No  . Sexual activity: None  Other Topics Concern  . None  Social History Narrative  . None   Family History  Problem Relation Age of Onset  . CAD Mother   . Glaucoma Mother   . COPD Father   . COPD Brother   . Parkinsonism Brother    Scheduled Meds: . allopurinol  100 mg Oral Daily  . ARIPiprazole  20 mg Oral Daily  . aspirin EC  81 mg Oral Daily  . atorvastatin  80 mg Oral Daily  . budesonide  0.25 mg Nebulization BID  . clopidogrel  75 mg Oral Daily  . diclofenac sodium  2 g Topical QID  . docusate sodium  100 mg Oral BID  . dorzolamide  1 drop Right Eye BID  . [START ON 11/17/2017] ferrous sulfate  325 mg Oral Q breakfast  . fluticasone  1 spray Each Nare Daily  . furosemide  40 mg Oral Daily  . insulin aspart  0-15 Units Subcutaneous TID WC  . insulin glargine  6 Units Subcutaneous QHS  . ipratropium-albuterol  3 mL Nebulization Q6H  . isosorbide mononitrate  60 mg Oral Daily  . lamoTRIgine  150 mg Oral Daily  . metoprolol tartrate  12.5 mg Oral BID  . omega-3 acid ethyl esters  1,000 mg Oral QPM  . pantoprazole  40 mg Oral Daily  . polyethylene glycol  17 g Oral Daily  . QUEtiapine  25 mg Oral BID  . QUEtiapine  50 mg Oral QHS  . sertraline  200 mg Oral QHS  . sucralfate  1 g Oral QID  . tiotropium  18 mcg Inhalation Daily   Continuous Infusions: PRN Meds:.acetaminophen **OR** acetaminophen, alum & mag hydroxide-simeth, Melatonin, nitroGLYCERIN, ondansetron **OR** ondansetron (ZOFRAN) IV, senna Medications Prior to Admission:  Prior to Admission medications   Medication Sig Start Date End Date Taking? Authorizing Provider  albuterol (PROVENTIL HFA;VENTOLIN HFA) 108 (90 Base) MCG/ACT inhaler Inhale 2 puffs into the lungs every 3 (three) hours as needed for wheezing  or shortness of breath.   Yes [provider]  allopurinol (ZYLOPRIM) 100 MG tablet Take 100 mg by mouth daily.   Yes [provider]  alum & mag hydroxide-simeth (MAALOX/MYLANTA) 200-200-20 MG/5ML suspension  Take 30 mLs by mouth every 4 (four) hours as needed for indigestion or heartburn.    Yes [provider]  ARIPiprazole (ABILIFY) 20 MG tablet Take 20 mg by mouth daily.   Yes [provider]  aspirin EC 81 MG tablet Take 81 mg by mouth daily.   Yes [provider]  atorvastatin (LIPITOR) 80 MG tablet Take 80 mg by mouth daily.   Yes [provider]  budesonide (PULMICORT) 0.25 MG/2ML nebulizer solution Take 2 mLs (0.25 mg total) by nebulization 2 (two) times daily. 11/14/17  Yes Altamese Dilling, MD  clopidogrel (PLAVIX) 75 MG tablet Take 75 mg by mouth daily.   Yes [provider]  diclofenac sodium (VOLTAREN) 1 % GEL Apply 2 g topically 4 (four) times daily.   Yes [provider]  dorzolamide (TRUSOPT) 2 % ophthalmic solution Place 1 drop into the right eye 2 (two) times daily.   Yes [provider]  ferrous sulfate 325 (65 FE) MG EC tablet Take 325 mg by mouth daily with breakfast.   Yes [provider]  fluticasone (FLONASE) 50 MCG/ACT nasal spray Place 1 spray into both nostrils daily.   Yes [provider]  Fluticasone-Salmeterol (ADVAIR) 500-50 MCG/DOSE AEPB Inhale 1 puff into the lungs 2 (two) times daily.   Yes [provider]  furosemide (LASIX) 40 MG tablet Take 1 tablet (40 mg total) by mouth daily. 11/15/17  Yes Altamese Dilling, MD  insulin glargine (LANTUS) 100 UNIT/ML injection Inject 0.1 mLs (10 Units total) into the skin at bedtime. 11/08/17  Yes Enedina Finner, MD  ipratropium-albuterol (DUONEB) 0.5-2.5 (3) MG/3ML SOLN Take 3 mLs by nebulization every 6 (six) hours. 11/14/17  Yes Altamese Dilling, MD  isosorbide mononitrate (IMDUR) 60 MG 24 hr tablet Take 1  tablet (60 mg total) by mouth daily. 11/15/17  Yes Altamese Dilling, MD  lamoTRIgine (LAMICTAL) 100 MG tablet Take 150 mg by mouth daily.   Yes [provider]  Melatonin 10 MG TABS Take 10 mg by mouth at bedtime as needed (for sleeping).   Yes [provider]  metoprolol tartrate (LOPRESSOR) 25 MG tablet Take 0.5 tablets (12.5 mg total) by mouth 2 (two) times daily. 11/14/17  Yes Altamese Dilling, MD  nitroGLYCERIN (NITROSTAT) 0.4 MG SL tablet Place 1 tablet (0.4 mg total) under the tongue every 5 (five) minutes as needed for chest pain. 11/14/17  Yes Altamese Dilling, MD  Omega 3 1000 MG CAPS Take 1,000 mg by mouth every evening.   Yes [provider]  omega-3 acid ethyl esters (LOVAZA) 1 g capsule Take 1 g by mouth daily.   Yes [provider]  omeprazole (PRILOSEC) 20 MG capsule Take 40 mg by mouth 2 (two) times daily before a meal.   Yes [provider]  polyethylene glycol (MIRALAX / GLYCOLAX) packet Take 17 g by mouth daily.   Yes [provider]  QUEtiapine (SEROQUEL) 25 MG tablet Take 25 mg by mouth 2 (two) times daily.   Yes [provider]  QUEtiapine (SEROQUEL) 50 MG tablet Take 50 mg by mouth at bedtime.   Yes [provider]  senna (SENOKOT) 8.6 MG TABS tablet Take 2 tablets by mouth at bedtime as needed for mild constipation.   Yes [provider]  sertraline (ZOLOFT) 100 MG tablet Take 200 mg by mouth at bedtime.   Yes [provider]  sucralfate (CARAFATE) 1 g tablet Take 1 g by mouth 4 (four) times daily.  Yes [provider]  tiotropium (SPIRIVA) 18 MCG inhalation capsule Place 18 mcg into inhaler and inhale daily.   Yes [provider]   Allergies  Allergen Reactions  . Ace Inhibitors     unknown  . Bactrim [Sulfamethoxazole-Trimethoprim] Itching  . Doxycycline Hyclate     unknown  . Hydrocodone     unknown  . Oxycodone     unknown  . Sulfanilamide       unknown   Review of Systems  Respiratory: Positive for shortness of breath.     Physical Exam  Constitutional: She appears well-developed and well-nourished.  HENT:  Head: Atraumatic.  Pulmonary/Chest:  BIPAP in place.  Neurological: She is alert.  Skin: Skin is warm and dry.    Vital Signs: BP 124/75 (BP Location: Left Leg)   Pulse 99   Temp 98.9 F (37.2 C) (Axillary)   Resp (!) 22   Ht 5\' 8"  (1.727 m)   Wt 117.5 kg (259 lb)   SpO2 100%   BMI 39.38 kg/m  Pain Assessment: CPOT   Pain Score: 7    SpO2: SpO2: 100 % O2 Device:SpO2: 100 % O2 Flow Rate: .   IO: Intake/output summary:   Intake/Output Summary (Last 24 hours) at 11/16/2017 1449 Last data filed at 11/16/2017 0537 Gross per 24 hour  Intake -  Output 500 ml  Net -500 ml    LBM:   Baseline Weight: Weight: 117.5 kg (259 lb) Most recent weight: Weight: 117.5 kg (259 lb)     Palliative Assessment/Data: 30%     Time In: 1:40 Time Out: 3:35 Time Total: 115 min Greater than 50%  of this time was spent counseling and coordinating care related to the above assessment and plan.  Signed by: Morton Stallrystal Azaliah Carrero, NP   Please contact Palliative Medicine Team phone at [phone] for questions and concerns.  For individual provider: See Loretha StaplerAmion

## 2017-11-16 NOTE — Progress Notes (Signed)
RT assisted with patient transport to ICU room from ER while on V60 Bipap with no complications.

## 2017-11-17 DIAGNOSIS — L8990 Pressure ulcer of unspecified site, unspecified stage: Secondary | ICD-10-CM

## 2017-11-17 LAB — GLUCOSE, CAPILLARY
Glucose-Capillary: 146 mg/dL — ABNORMAL HIGH (ref 65–99)
Glucose-Capillary: 170 mg/dL — ABNORMAL HIGH (ref 65–99)

## 2017-11-17 MED ORDER — MORPHINE SULFATE (CONCENTRATE) 20 MG/ML PO SOLN: 10.0000 mg | ORAL | 0 refills | Status: AC | PRN

## 2017-11-17 MED ORDER — GLYCOPYRROLATE 0.2 MG/ML IJ SOLN
0.1000 mg | Freq: Three times a day (TID) | INTRAMUSCULAR | Status: DC | PRN
  Administered 2017-11-17: 0.1 mg via INTRAVENOUS
  Filled 2017-11-17: qty 1

## 2017-11-17 MED ORDER — ONDANSETRON HCL 4 MG PO TABS: 4.0000 mg | ORAL_TABLET | Freq: Four times a day (QID) | ORAL | 0 refills | Status: AC | PRN

## 2017-11-17 MED ORDER — GUAIFENESIN-CODEINE 100-10 MG/5ML PO SOLN
5.0000 mL | ORAL | Status: DC | PRN
  Administered 2017-11-17: 5 mL via ORAL
  Filled 2017-11-17: qty 5

## 2017-11-17 NOTE — Progress Notes (Signed)
Daily Progress Note   Patient Name: Cathy Guzman       Date: 11/17/2017 DOB: 10/18/1945  Age: 72 y.o. MRN#: [id number] Attending Physician: Altamese DillingVachhani, Vaibhavkumar, * Primary Care Physician: Dorothey BasemanBronstein, David, MD Admit Date: 11/16/2017  Reason for Consultation/Follow-up: Establishing goals of care  Subjective: Cathy Guzman is resting in bed. She is alert and oriented and off BIPAP. CCM at bedside, states she is as optimal as possible for discharge home with hospice. Patient states she wants to go home and be made comfortable there, not to return to the hospital. Message left for Gueydanindy.   Length of Stay: 1  Current Medications: Scheduled Meds:  . allopurinol  100 mg Oral Daily  . ARIPiprazole  20 mg Oral Daily  . aspirin EC  81 mg Oral Daily  . atorvastatin  80 mg Oral Daily  . budesonide  0.25 mg Nebulization BID  . chlorhexidine  15 mL Mouth Rinse BID  . clopidogrel  75 mg Oral Daily  . diclofenac sodium  2 g Topical QID  . docusate sodium  100 mg Oral BID  . dorzolamide  1 drop Right Eye BID  . ferrous sulfate  325 mg Oral Q breakfast  . fluticasone  1 spray Each Nare Daily  . furosemide  40 mg Oral Daily  . insulin aspart  0-15 Units Subcutaneous TID WC  . insulin glargine  6 Units Subcutaneous QHS  . ipratropium-albuterol  3 mL Nebulization Q6H  . isosorbide mononitrate  60 mg Oral Daily  . lamoTRIgine  150 mg Oral Daily  . mouth rinse  15 mL Mouth Rinse q12n4p  . metoprolol tartrate  12.5 mg Oral BID  . omega-3 acid ethyl esters  1,000 mg Oral QPM  . pantoprazole  40 mg Oral Daily  . polyethylene glycol  17 g Oral Daily  . QUEtiapine  25 mg Oral BID  . QUEtiapine  50 mg Oral QHS  . sertraline  200 mg Oral QHS  . sucralfate  1 g Oral QID  . tiotropium  18 mcg Inhalation Daily      Continuous Infusions:   PRN Meds: acetaminophen **OR** acetaminophen, alum & mag hydroxide-simeth, glycopyrrolate, guaiFENesin-codeine, Melatonin, morphine injection, nitroGLYCERIN, ondansetron **OR** ondansetron (ZOFRAN) IV, senna  Physical Exam  Constitutional: No distress.  Pulmonary/Chest: Effort normal.  Neurological: She is  alert.  Oriented  Skin: Skin is warm and dry.            Vital Signs: BP 97/66   Pulse (!) 109   Temp 98.4 F (36.9 C) (Axillary)   Resp 19   Ht 5\' 8"  (1.727 m)   Wt 111.3 kg (245 lb 6 oz)   SpO2 (!) 89%   BMI 37.31 kg/m  SpO2: SpO2: (!) 89 % O2 Device: O2 Device: Nasal Cannula O2 Flow Rate: O2 Flow Rate (L/min): 4 L/min(turned back to 5)  Intake/output summary:   Intake/Output Summary (Last 24 hours) at 11/17/2017 8657 Last data filed at 11/17/2017 0500 Gross per 24 hour  Intake 480 ml  Output 200 ml  Net 280 ml   LBM:   Baseline Weight: Weight: 117.5 kg (259 lb) Most recent weight: Weight: 111.3 kg (245 lb 6 oz)       Palliative Assessment/Data: 30%      Patient Active Problem List   Diagnosis Date Noted  . Pressure injury of skin 11/17/2017  . DNR (do not resuscitate)   . Palliative care by specialist   . Stage 4 chronic kidney disease (HCC)   . Acute congestive heart failure (HCC)   . Acute on chronic respiratory failure with hypoxia (HCC) 11/10/2017  . COPD with acute exacerbation (HCC) 11/10/2017  . Acute on chronic respiratory failure (HCC) 11/10/2017  . Hyponatremia 11/03/2017  . Diabetes (HCC) 11/03/2017  . COPD (chronic obstructive pulmonary disease) (HCC) 11/03/2017  . CAD (coronary artery disease) 11/03/2017  . HLD (hyperlipidemia) 11/03/2017  . Chronic diastolic CHF (congestive heart failure) (HCC) 11/03/2017  . GERD (gastroesophageal reflux disease) 11/03/2017  . HTN (hypertension) 11/03/2017    Palliative Care Assessment & Plan   Patient Profile: The patient with past medical history of CAD, COPD, CHF and  diabetes presents the emergency department via EMS due to shortness of breath.   Assessment/Recommendations/Plan:  Patient off BIAP. In accordance with her wishes, CM working [facility].    Code Status:    Code Status Orders  (From admission, onward)        Start     Ordered   11/16/17 0943  Do not attempt resuscitation (DNR)  Continuous    Question Answer Comment  In the event of cardiac or respiratory ARREST Do not call a "code blue"   In the event of cardiac or respiratory ARREST Do not perform Intubation, CPR, defibrillation or ACLS   In the event of cardiac or respiratory ARREST Use medication by any route, position, wound care, and other measures to relive pain and suffering. May use oxygen, suction and manual treatment of airway obstruction as needed for comfort.   Comments confirmed with pt.      11/16/17 0942    Code Status History    Date Active Date Inactive Code Status Order ID Comments User Context   11/10/2017 12:20 11/14/2017 20:41 DNR [id number]  Altamese Dilling, MD Inpatient   11/10/2017 10:54 11/10/2017 12:20 DNR [id number]  Altamese Dilling, MD ED   11/04/2017 01:34 11/08/2017 13:33 Full Code [id number]  Oralia Manis, MD Inpatient    Advance Directive Documentation     Most Recent Value  Type of Advance Directive  Healthcare Power of Attorney, Living will  Pre-existing out of facility DNR order (yellow form or pink MOST form)  No data  "MOST" Form in Place?  No data       Prognosis:   < 2 weeks  Discharge Planning:  Home with Hospice  Care plan was discussed with CCM and CM  Thank you for allowing the Palliative Medicine Team to assist in the care of this patient.   Total Time 35 min Prolonged Time Billed No      Greater than 50%  of this time was spent counseling and coordinating care related to the above assessment and plan.  Morton Stall, NP  Please contact Palliative Medicine Team phone at [phone] for  questions and concerns.

## 2017-11-17 NOTE — Progress Notes (Signed)
Seen pt in ICU. she is well known to me due to recent admission. She is on Bipap, alert and oriented. Here for worsening of CHF. Recent CAD , have CKD. I agree with current management and I have called palliative care to discuss the plan and I strongly suggest hospice.

## 2017-11-17 NOTE — Progress Notes (Signed)
[facility] Critical Care Medicine Progess Note    SYNOPSIS   72 year old female with COPD, chronic hypercapnic respiratory failure coronary artery disease, diastolic heart failure presented with recurrent hypercapnic respiratory failure requiring noninvasive ventilation.  ASSESSMENT/PLAN  Chronic respiratory failure. Presently being treated with short acting bronchodilators include albuterol, Atrovent and budesonide. Has been weaned off of NIV. Discussed with palliative care. Will be discharged home with hospice today  Leukocytosis. No clear evidence of infection  Anemia. No evidence of active bleeding  Renal insufficiency  Hyperglycemia on coverage  VENTILATOR SETTINGS: FiO2 (%):  [40 %] 40 %  INTAKE / OUTPUT:  Intake/Output Summary (Last 24 hours) at 11/17/2017 0817 Last data filed at 11/17/2017 0500 Gross per 24 hour  Intake 480 ml  Output 200 ml  Net 280 ml    Name: Cathy Guzman MRN: [id number] DOB: 05/18/1946    ADMISSION DATE:  11/16/2017   SUBJECTIVE:   Patient doing well this morning, awake and alert on nasal cannula has been weaned off of NIV.   VITAL SIGNS: Temp:  [96.3 F (35.7 C)-98.9 F (37.2 C)] 96.3 F (35.7 C) (02/08 0200) Pulse Rate:  [48-118] 98 (02/08 0600) Resp:  [14-35] 15 (02/08 0600) BP: (97-141)/(43-88) 97/66 (02/08 0600) SpO2:  [92 %-100 %] 96 % (02/08 0600) FiO2 (%):  [40 %] 40 % (02/07 1800) Weight:  [245 lb 6 oz (111.3 kg)] 245 lb 6 oz (111.3 kg) (02/08 0221)   PHYSICAL EXAMINATION: Physical Examination:   VS: BP 97/66   Pulse 98   Temp (!) 96.3 F (35.7 C) (Axillary)   Resp 15   Ht 5\' 8"  (1.727 m)   Wt 245 lb 6 oz (111.3 kg)   SpO2 96%   BMI 37.31 kg/m   General Appearance: No distress  Neuro:without focal findings, mental status normal. HEENT: Blind in left eye, trachea midline, no thyromegaly appreciated, no clear jugular venous distention noted Pulmonary: normal breath sounds, diminished excursion    CardiovascularNormal S1,S2.  Abdomen: Benign, Soft, non-tender. Skin:   warm, no rashes, no ecchymosis  Extremities: normal, no cyanosis, clubbing.    LABORATORY PANEL:   CBC Recent Labs  Lab 11/16/17 0502  WBC 13.5*  HGB 8.7*  HCT 28.0*  PLT 227    Chemistries  Recent Labs  Lab 11/12/17 0608 11/13/17 0304  11/16/17 0502  NA 129* 134*   < > 140  K 4.7 4.1   < > 4.1  CL 85* 88*   < > 91*  CO2 31 32   < > 31  GLUCOSE 202* 119*   < > 228*  BUN 60* 61*   < > 53*  CREATININE 2.59* 2.48*   < > 2.47*  CALCIUM 9.3 9.6   < > 10.4*  MG 3.2*  --   --   --   PHOS 4.3 3.2  --   --   AST  --   --   --  23  ALT  --   --   --  16  ALKPHOS  --   --   --  87  BILITOT  --   --   --  1.3*   < > = values in this interval not displayed.    Recent Labs  Lab 11/14/17 0711 11/14/17 1116 11/14/17 1624 11/16/17 1208 11/16/17 1302 11/17/17 0736  GLUCAP 112* 159* 188* 149* 142* 146*   Recent Labs  Lab 11/10/17 0948 11/16/17 0501  PHART 7.36 7.43  PCO2ART 60* 53*  PO2ART 62* 68*   Recent Labs  Lab 11/13/17 0304 11/16/17 0502  AST  --  23  ALT  --  16  ALKPHOS  --  87  BILITOT  --  1.3*  ALBUMIN 2.9* 3.3*    Cardiac Enzymes Recent Labs  Lab 11/16/17 0502  TROPONINI 0.61*    RADIOLOGY:  Dg Chest Portable 1 View  Result Date: 11/16/2017 CLINICAL DATA:  Initial evaluation for acute chest pain with shortness of breath. EXAM: PORTABLE CHEST 1 VIEW COMPARISON:  Prior radiograph from 11/11/2017. FINDINGS: Stable cardiomegaly.  Mediastinal silhouette within normal limits. Lungs hypoinflated. Diffuse vascular congestion with interstitial prominence, suggesting CHF. No obvious large pleural effusion. Superimposed infiltrates would be difficult to exclude, but are less favored. No pneumothorax. Osseous structures are unchanged. IMPRESSION: Cardiomegaly with moderate diffuse pulmonary edema, suggesting CHF. Electronically Signed   By: Rise Mu M.D.   On: 11/16/2017  05:45     Tora Kindred, DO  2/8/2019Patient ID: Cathy Guzman, female   DOB: 10-16-1945, 72 y.o.   MRN: [id number]

## 2017-11-17 NOTE — Care Management (Signed)
UPdated patient, primary nurse and attending on plans for the discharge.  A member of patient's family will come to the unit to get patient's personal belongings.  This person should be given all discharge instructions and scripts.  Focus on the Roxanol so it  can be filled to have on hand when patient arrives home. Family member to call the nursing unit when the hospital bed gets delivered to the home and primary nurse will in turn call ems for transport.  None emergent transport form completed and on the chart for EMS.  There is a signed DNR that will transport with patient

## 2017-11-17 NOTE — Clinical Social Work Note (Signed)
Patient has been readmitted a few times for her medical conditions recently. Palliative Care has spoken to patient and she wishes to return home with hospice and not return to [facility]. RN CM is involved. Please consult CSW if needed. York SpanielMonica Odelle Kosier MSW,LcSW [phone]

## 2017-11-17 NOTE — Progress Notes (Signed)
Pt complained of pain and a cough overnight. Pt refused medications and requested to just rest. Pt remains on no gtts and family is at bedside.

## 2017-11-17 NOTE — Care Management (Signed)
Patient for discharge home today under hospice services.  [facility].  Per daughter Arline AspCindy- [facility]  [facility]  [facility].  Will need hospital bed, hoyer lift, over bed table.   Already has BSC and wheelchair.  Notified ICU staff that anticipate patient will discharge today and discharge needs to be completed. Requested portable DNR. Hospital bed will have to be in place before arrives home.

## 2017-11-17 NOTE — Progress Notes (Signed)
[facility] [place] NAME: Cathy Guzman    MR#:  [id number]  DATE OF BIRTH:  11/24/1945  SUBJECTIVE:  CHIEF COMPLAINT:   Chief Complaint  Patient presents with  . Chest Pain  . Shortness of Breath   Came  With respi distress. SHe had repeated admissions for the same reason. On Bipap. Seen by palliative care, agreed for comfort measures at home and plan for hospice at home.  REVIEW OF SYSTEMS:  CONSTITUTIONAL: No fever,positive for fatigue or weakness.  EYES: No blurred or double vision.  EARS, NOSE, AND THROAT: No tinnitus or ear pain.  RESPIRATORY: No cough, have shortness of breath,no wheezing or hemoptysis.  CARDIOVASCULAR: No chest pain, orthopnea,b/l edema.  GASTROINTESTINAL: No nausea, vomiting, diarrhea or abdominal pain.  GENITOURINARY: No dysuria, hematuria.  ENDOCRINE: No polyuria, nocturia,  HEMATOLOGY: No anemia, have easy bruising ,no bleeding SKIN: No rash or lesion. MUSCULOSKELETAL: No joint pain or arthritis.   NEUROLOGIC: No tingling, numbness, weakness.  PSYCHIATRY: No anxiety or depression.   ROS  DRUG ALLERGIES:   Allergies  Allergen Reactions  . Ace Inhibitors     unknown  . Bactrim [Sulfamethoxazole-Trimethoprim] Itching  . Doxycycline Hyclate     unknown  . Hydrocodone     unknown  . Oxycodone     unknown  . Sulfanilamide     unknown    VITALS:  Blood pressure (!) 80/57, pulse 97, temperature 98.6 F (37 C), temperature source Axillary, resp. rate 15, height _0  (1.727 m), weight 111.3 kg (245 lb 6 oz), SpO2 94 %.  PHYSICAL EXAMINATION:  GENERAL:  72 y.o.-year-old patient lying in the bed with no acute distress.  EYES: Pupils equal, round, reactive to light and accommodation. No scleral icterus. Extraocular muscles intact. Left side fake eye. HEENT: Head atraumatic, normocephalic. Oropharynx and nasopharynx clear.  NECK:  Supple, no jugular venous distention. No thyroid enlargement, no  tenderness.  LUNGS: Normal breath sounds bilaterally, no wheezing, b/l crepitation. No use of accessory muscles of respiration. On nasal canula. CARDIOVASCULAR: S1, S2 normal. No murmurs, rubs, or gallops.  ABDOMEN: Soft, nontender, nondistended. Bowel sounds present. No organomegaly or mass.  EXTREMITIES: No pedal edema, cyanosis, or clubbing. Have multiple echymoses. NEUROLOGIC: Cranial nerves II through XII are intact. Muscle strength 3-4/5 in all extremities. Sensation intact. Gait not checked.  PSYCHIATRIC: The patient is alert and oriented x 3.  SKIN: No obvious rash, lesion, or ulcer.   Physical Exam LABORATORY PANEL:   CBC Recent Labs  Lab 11/16/17 0502  WBC 13.5*  HGB 8.7*  HCT 28.0*  PLT 227   ------------------------------------------------------------------------------------------------------------------  Chemistries  Recent Labs  Lab 11/12/17 0608  11/16/17 0502  NA 129*   < > 140  K 4.7   < > 4.1  CL 85*   < > 91*  CO2 31   < > 31  GLUCOSE 202*   < > 228*  BUN 60*   < > 53*  CREATININE 2.59*   < > 2.47*  CALCIUM 9.3   < > 10.4*  MG 3.2*  --   --   AST  --   --  23  ALT  --   --  16  ALKPHOS  --   --  87  BILITOT  --   --  1.3*   < > = values in this interval not displayed.   ------------------------------------------------------------------------------------------------------------------  Cardiac Enzymes Recent Labs  Lab 11/12/17 [phone] 11/16/17 0502  TROPONINI 3.14* 0.61*   ------------------------------------------------------------------------------------------------------------------  RADIOLOGY:  Dg Chest Portable 1 View  Result Date: 11/16/2017 CLINICAL DATA:  Initial evaluation for acute chest pain with shortness of breath. EXAM: PORTABLE CHEST 1 VIEW COMPARISON:  Prior radiograph from 11/11/2017. FINDINGS: Stable cardiomegaly.  Mediastinal silhouette within normal limits. Lungs hypoinflated. Diffuse vascular congestion with interstitial  prominence, suggesting CHF. No obvious large pleural effusion. Superimposed infiltrates would be difficult to exclude, but are less favored. No pneumothorax. Osseous structures are unchanged. IMPRESSION: Cardiomegaly with moderate diffuse pulmonary edema, suggesting CHF. Electronically Signed   By: Jeannine Boga M.D.   On: 11/16/2017 05:45    ASSESSMENT AND PLAN:   Active Problems:   Acute on chronic respiratory failure with hypoxia (HCC)   Pressure injury of skin   * Ac on ch respi failure * Ac on ch diastolic CHF   Nasal canula oxygen.   IV lasix   Improved from BIPAP>   Palliative care met with family, plan is hospice d/c at home.  * CKD stage 3   Avoid nephrotoxic meds, monitor.  * COPD   Cont Inhaled steroids  * DM   Cont basal insulin + Iss.  * Hyperlipidemia    Cont statin.   All the records are reviewed and case discussed with Care Management/Social Workerr. Management plans discussed with the patient, family and they are in agreement.  CODE STATUS: DNR  TOTAL TIME TAKING CARE OF THIS PATIENT: 35 minutes.     POSSIBLE D/C IN 1-2 DAYS, DEPENDING ON CLINICAL CONDITION.   Vaughan Basta M.D on 11/17/2017   Between 7am to 6pm - Pager - [phone]  After 6pm go to [email or web address] - password [email or web address] [facility]  Office  [phone]  CC: Primary care physician; Juluis Pitch, MD  Note: This dictation was prepared with Dragon dictation along with smaller phrase technology. Any transcriptional errors that result from this process are unintentional.

## 2017-11-17 NOTE — Discharge Summary (Signed)
Physician Discharge Summary  Patient ID: Cathy KassCynthia Eckart MRN: [id number] DOB/AGE: 74/11/1945 72 y.o.  Admit date: 11/16/2017 Discharge date: 11/17/2017  Admission Diagnoses: Acute on chronic hypercapnic respiratory failure  Discharge Diagnoses:  Active Problems:   Acute on chronic respiratory failure with hypoxia (HCC)   Pressure injury of skin   Discharged Condition: guarded  Hospital Course: Cathy Guzman is an 72 y.o. female.with multiple medical problems to include hypertension, hyperlipidemia, chronic obstructive pulmonary disease, chronic hypercapnic respiratory failure, coronary artery disease, gastroesophageal reflux disease, diastolic heart failure, iron deficiency anemia who presented to the emergency department after recently being discharged from rehabilitation.  She became short of breath with some associated chest discomfort.  Chest x-ray showed pulmonary edema pattern, her respiratory status worsened requiring noninvasive ventilation.  Pertinent labs in the emergency department revealed a troponin of 0.61, BUN is 53 with a creatinine of 2.47, chloride is 91, Anion gap is 18, leukocytosis of 13.5, hemoglobin of 8.7, arterial blood gas on 16/6 was 7.43/50 3/68.  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  Patient was admitted to the intensive care unit, started on noninvasive ventilation, was also given bronchodilators and diuretic therapy.  She was eventually weaned off of noninvasive ventilation, was awake and alert oriented and communicating.  Palliative/hospice consult was obtained.  Per daughter and Mrs. Degroat she wishes to be discharged with home hospice care.  This is all been arranged and being discharged today.  Consults: Palliative Care Consultation  Treatments: Patient received noninvasive ventilation, bronchodilator therapy, diuretic therapy and was weaned to nasal cannula.  Discharge  Exam: Blood pressure 99/81, pulse (!) 119, temperature 98.4 F (36.9 C), temperature source Axillary, resp. rate (!) 22, height 5\' 8"  (1.727 m), weight 245 lb 6 oz (111.3 kg), SpO2 94 %. General appearance: alert  Disposition: Patient will be discharged to Home with Hospice Care   Medical reconciliation performed.  Please see discharge medication list  Follow-up Information    [facility] Follow up on 11/21/2017.   Specialty:  Cardiology Why:  at 12:00 noon Contact information: [address] [place] [id number] [phone]          Signed: Tora KindredCONFORTI,Nickolette Espinola 11/17/2017, 2:51 PM

## 2017-11-19 LAB — URINE CULTURE
Culture: >=100000 — AB
Special Requests: NORMAL

## 2017-11-21 ENCOUNTER — Ambulatory Visit: Payer: Medicare Other | Admitting: Family

## 2017-12-08 DEATH — deceased

## 2018-09-19 IMAGING — DX DG CHEST 1V
1 series · 1 of 1 positions shown · non-contrast
Comparison: None.

CLINICAL DATA: Shortness of Breath

EXAM:
CHEST 1 VIEW

[chest ap]
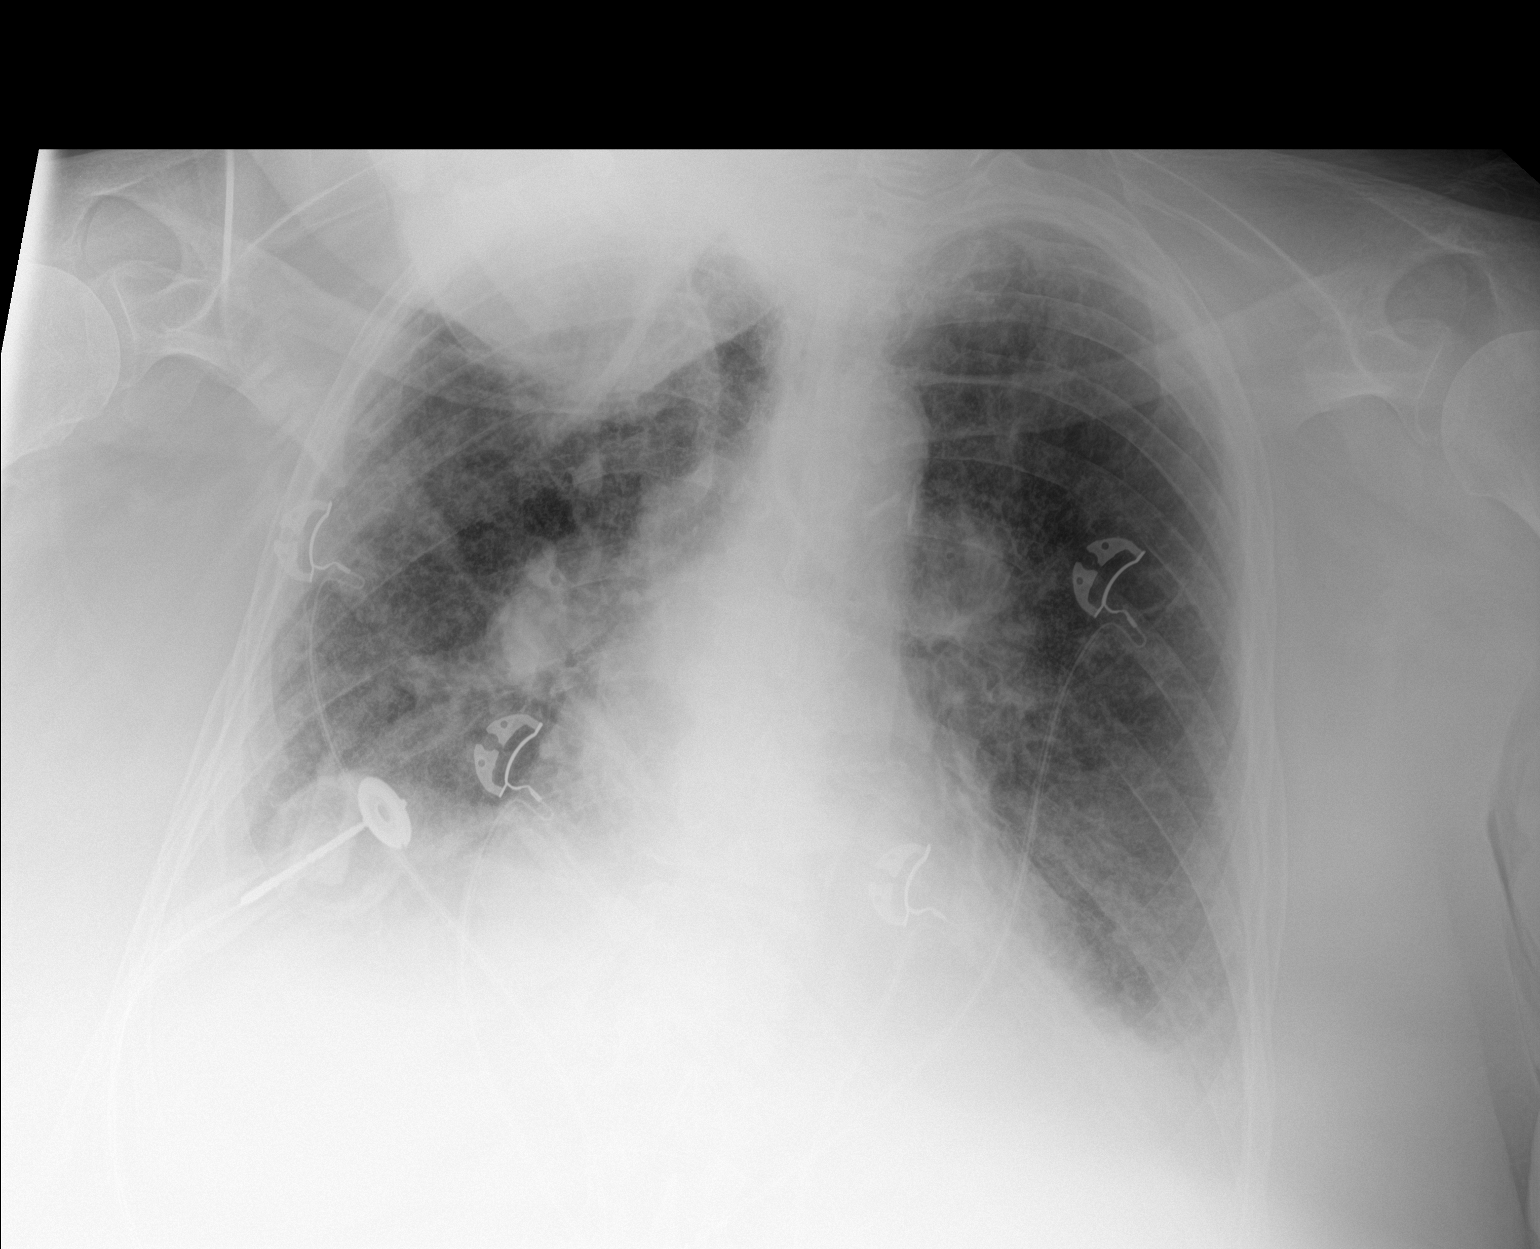

[1 of 1 positions shown; findings below may reference images not displayed]

FINDINGS: Cardiac shadow is mildly enlarged. Prominent central vascularity is
noted with some mild interstitial edema. Bibasilar atelectatic
changes are seen. No sizable effusion is noted. No acute bony
abnormality is seen.
IMPRESSION: Prominent central vascularity with mild interstitial edema.

## 2018-09-20 IMAGING — DX DG CHEST 1V PORT
1 series · 1 of 1 positions shown · non-contrast
Comparison: 11/10/2017

CLINICAL DATA: Congestive heart failure.

EXAM:
PORTABLE CHEST 1 VIEW

[chest ap]
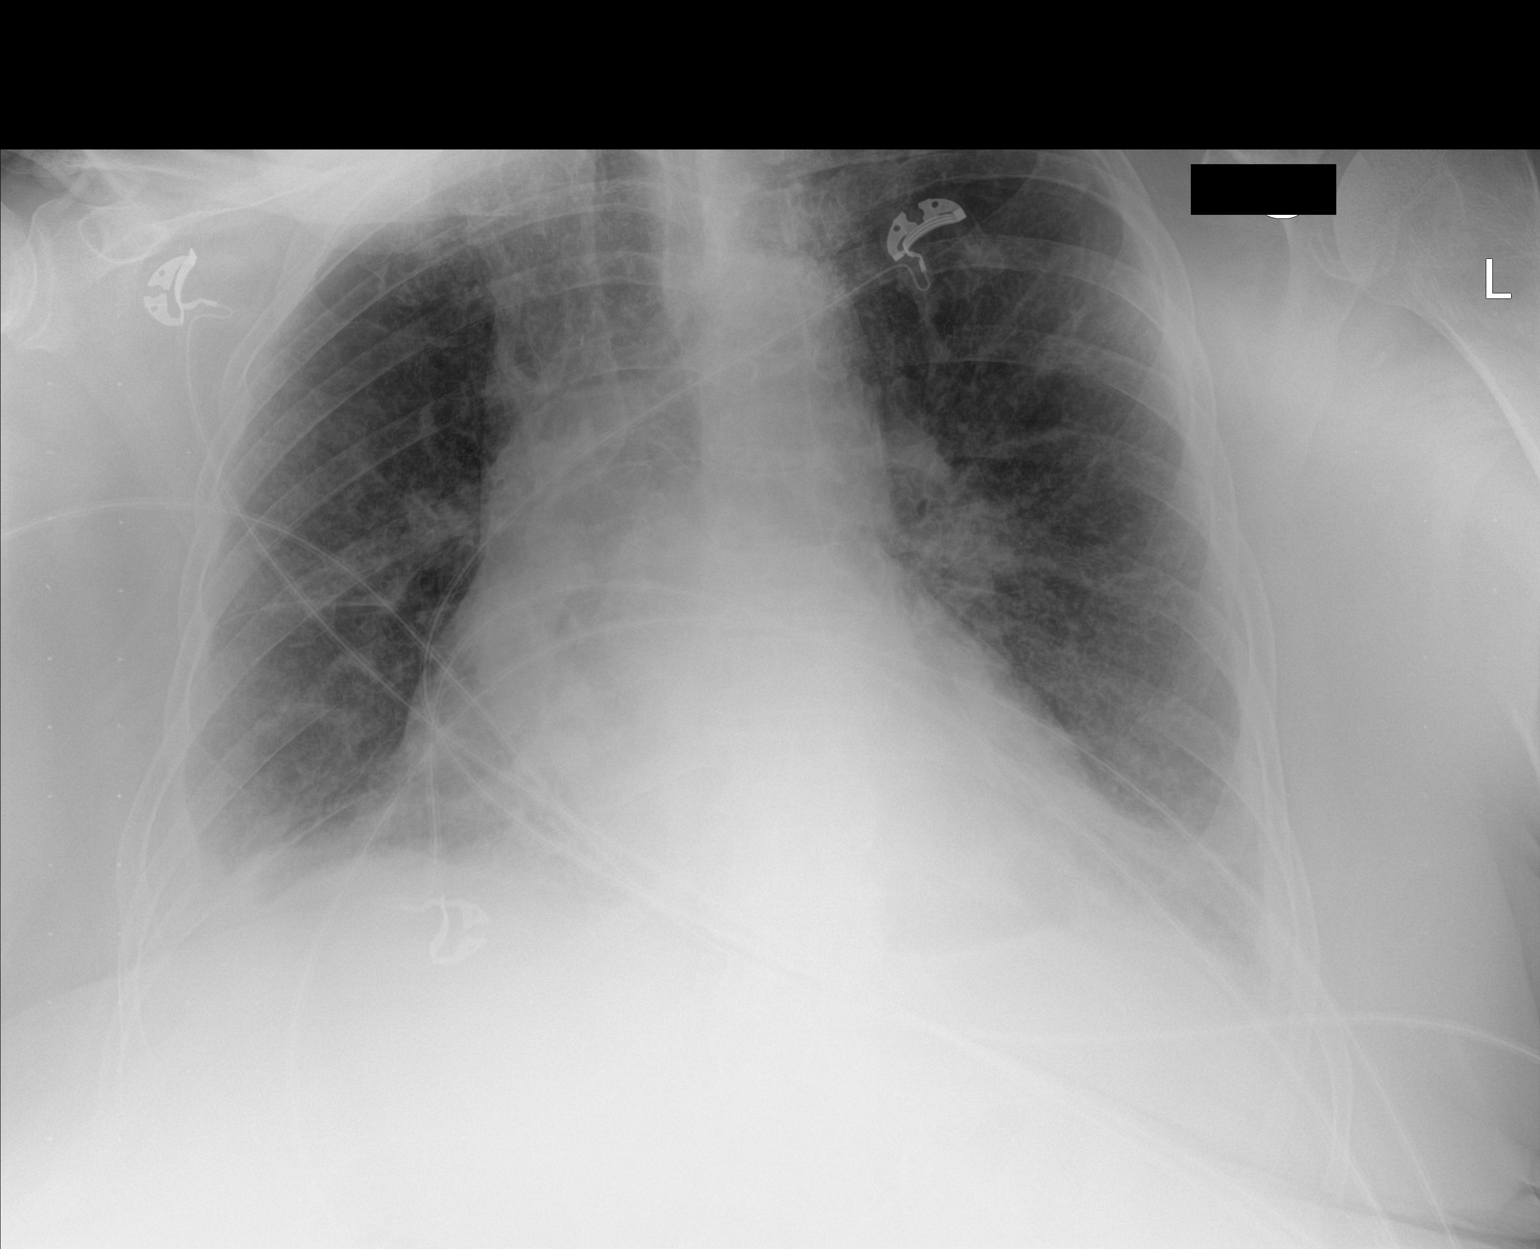

[1 of 1 positions shown; findings below may reference images not displayed]

FINDINGS: The patient is rotated to the right. The cardiac silhouette is
enlarged. Pulmonary vascular congestion and mild interstitial edema
have slightly improved. Bibasilar atelectasis is again noted. No
large pleural effusion or pneumothorax is identified.
IMPRESSION: Slight improvement of mild interstitial edema.

## 2018-10-19 IMAGING — US US RENAL
1 series · 14 of 25 positions shown · non-contrast
Comparison: None.

CLINICAL DATA: Acute renal failure.

EXAM:
RENAL / URINARY TRACT ULTRASOUND COMPLETE

[Series 1: us renal · 0.30mm/px · 14 of 34 slices shown]
[im 1/34]
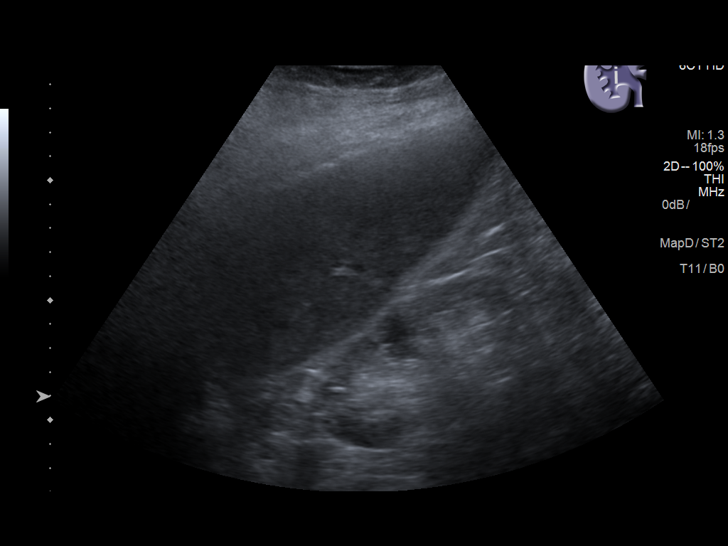
[im 3/34]
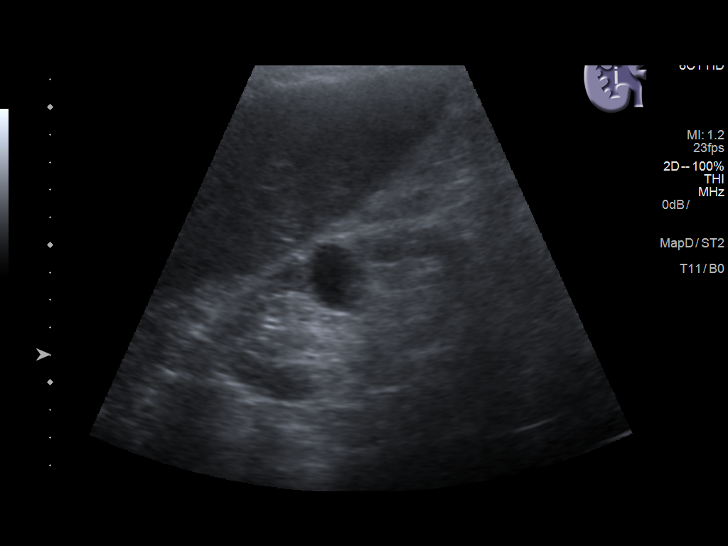
[im 6/34]
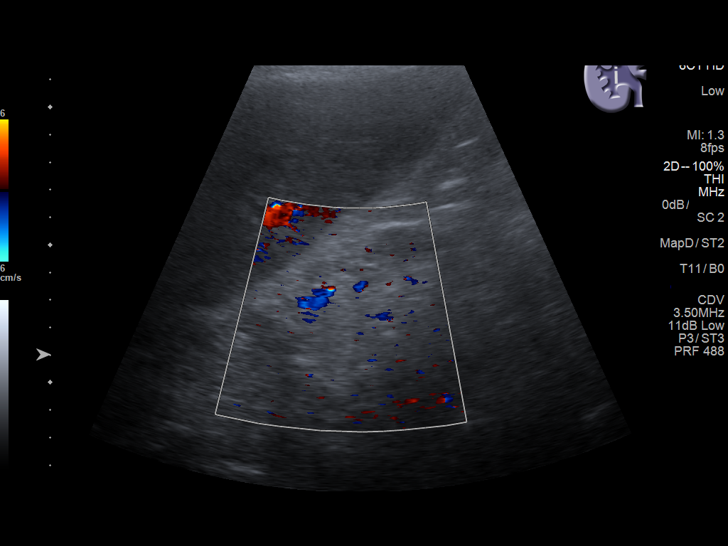
[im 9/34]
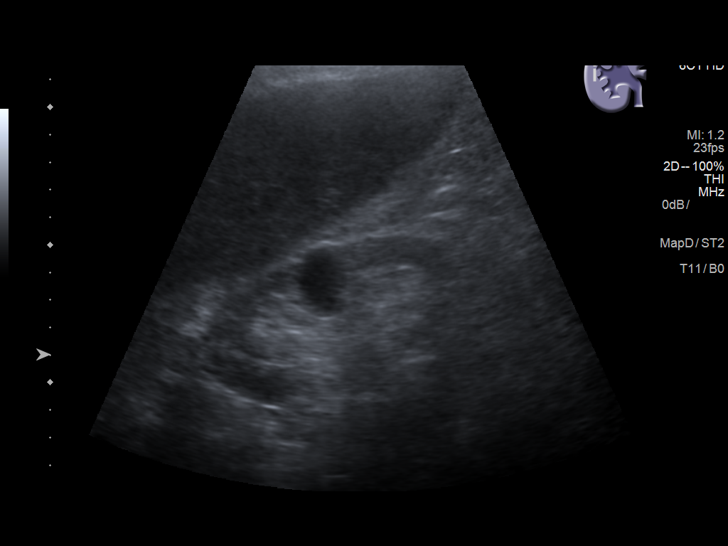
[im 12/34]
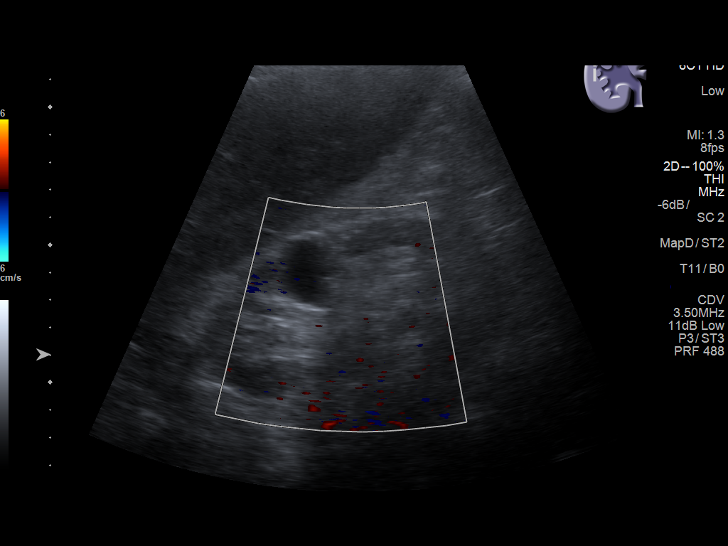
[im 13/34]
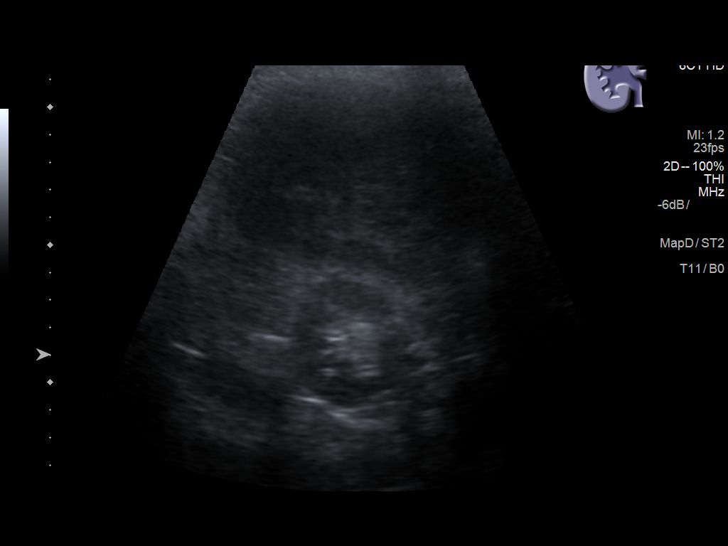
[im 16/34]
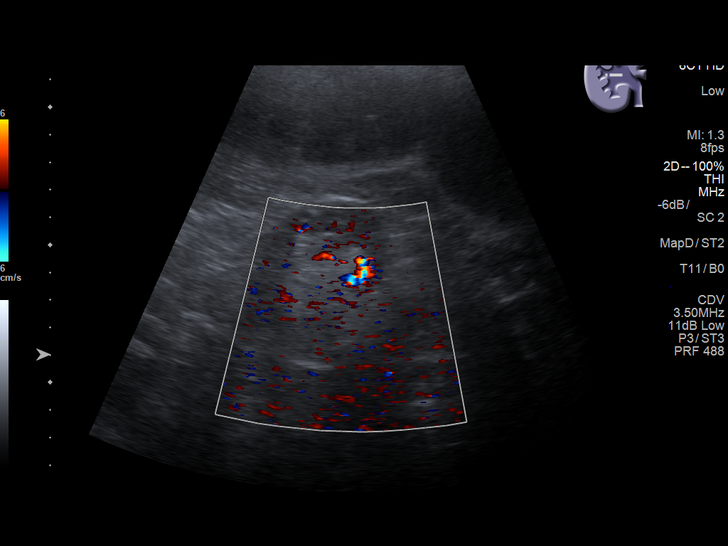
[im 18/34]
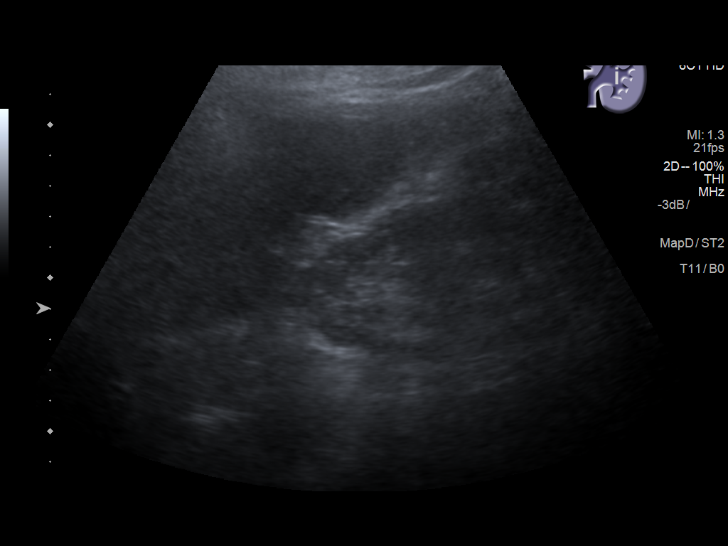
[im 21/34]
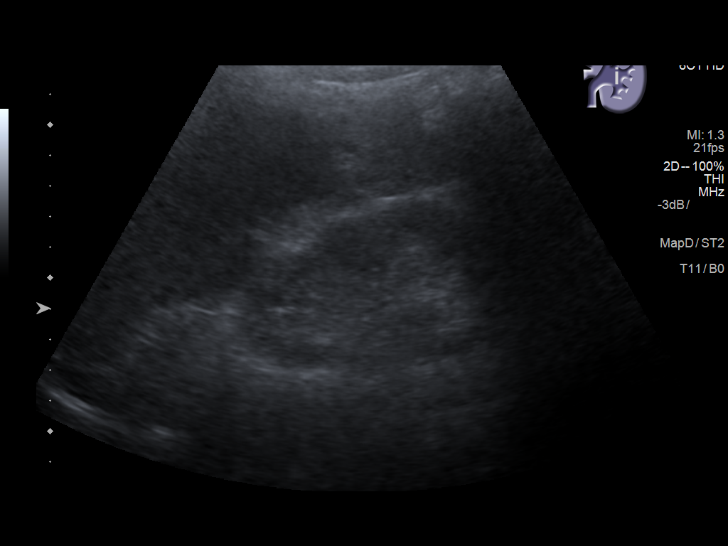
[im 23/34]
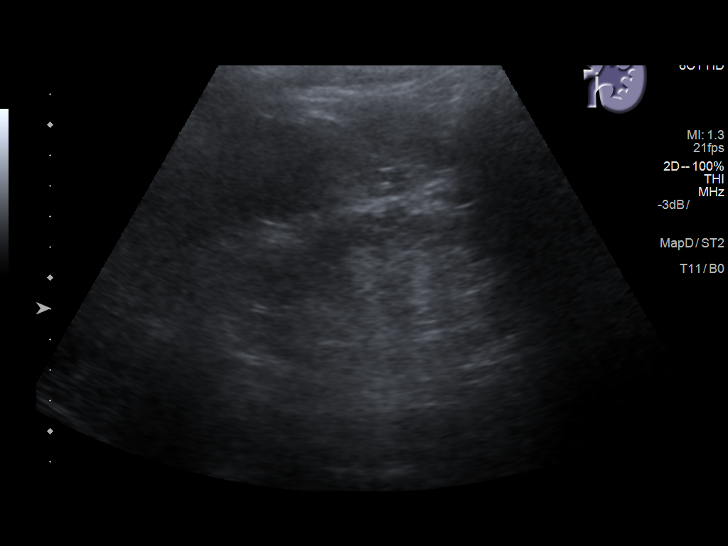
[im 25/34]
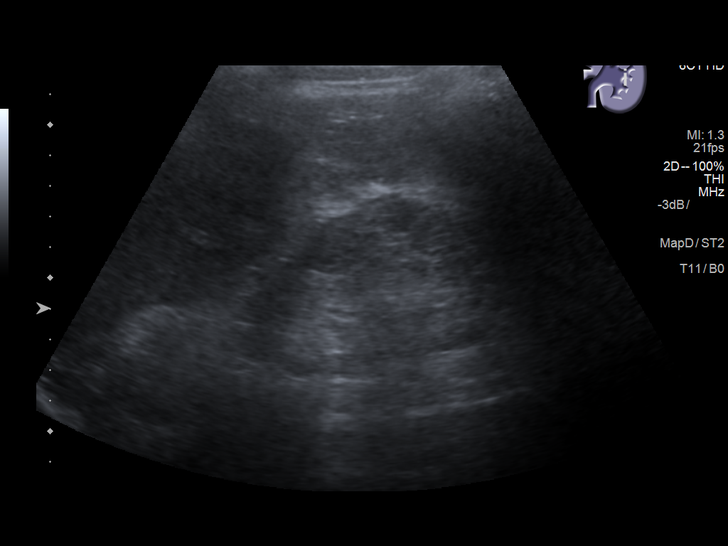
[im 28/34]
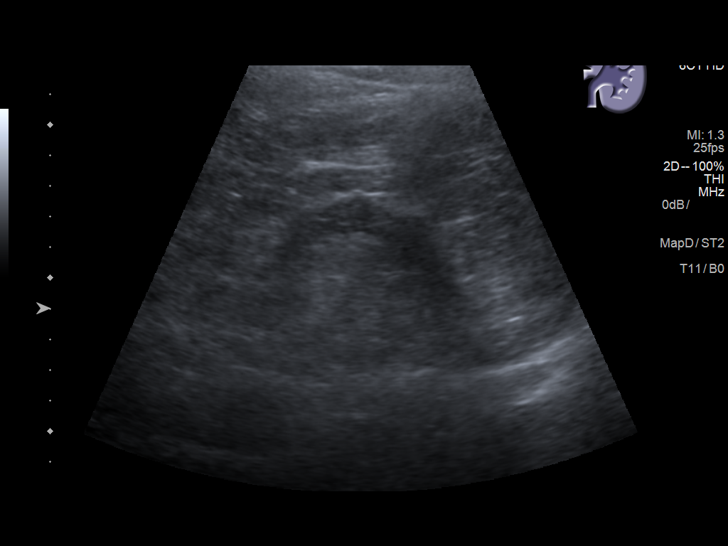
[im 31/34]
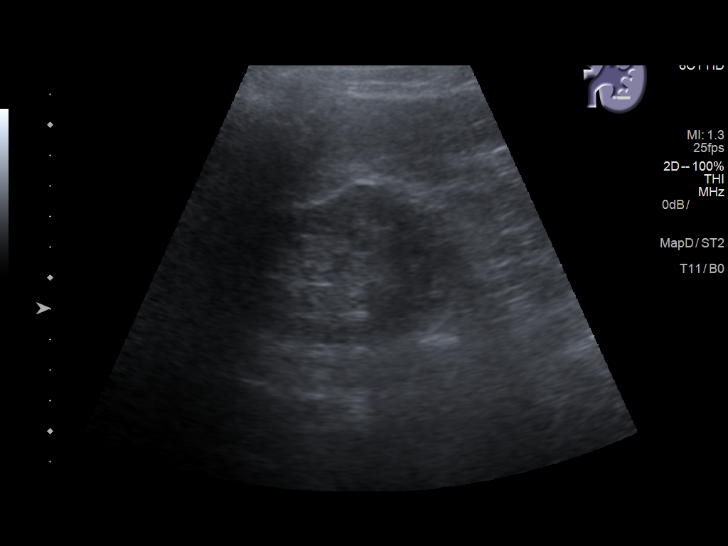
[im 34/34]
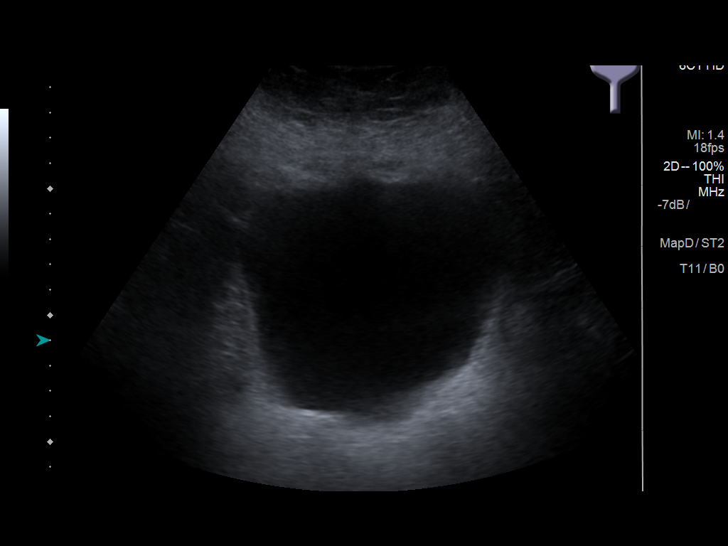

[14 of 25 positions shown; findings below may reference images not displayed]

FINDINGS: Right Kidney:

Length: 10.1 cm. 2.8 cm simple cyst is noted in midpole. Increased
echogenicity of renal parenchyma is noted. No mass or hydronephrosis
visualized.

Left Kidney:

Length: 10.5 cm.. Increased echogenicity of renal parenchyma is
noted. No mass or hydronephrosis visualized.

Bladder:

Appears normal for degree of bladder distention.
IMPRESSION: Increased echogenicity of renal parenchyma is noted bilaterally
suggesting medical renal disease. No hydronephrosis or renal
obstruction is noted.
